# Patient Record
Sex: Female | Born: 1945 | Race: Black or African American | Hispanic: No | Smoking: Never smoker
Health system: Southern US, Community
[De-identification: ages and names within clinical notes are randomized; demographics above are authoritative.]

## PROBLEM LIST (undated history)

## (undated) DIAGNOSIS — E119 Type 2 diabetes mellitus without complications: Secondary | ICD-10-CM

## (undated) DIAGNOSIS — R42 Dizziness and giddiness: Secondary | ICD-10-CM

## (undated) DIAGNOSIS — K219 Gastro-esophageal reflux disease without esophagitis: Secondary | ICD-10-CM

## (undated) DIAGNOSIS — E079 Disorder of thyroid, unspecified: Secondary | ICD-10-CM

## (undated) HISTORY — DX: Gastro-esophageal reflux disease without esophagitis: K21.9

## (undated) HISTORY — DX: Type 2 diabetes mellitus without complications: E11.9

## (undated) HISTORY — PX: ABDOMINAL HYSTERECTOMY: SHX81

## (undated) HISTORY — DX: Disorder of thyroid, unspecified: E07.9

---

## 2016-06-11 ENCOUNTER — Ambulatory Visit (INDEPENDENT_AMBULATORY_CARE_PROVIDER_SITE_OTHER): Payer: Medicare Other | Admitting: Family Medicine

## 2016-06-11 VITALS — BP 114/78 | HR 79 | Temp 98.0°F | Resp 17 | Ht 65.0 in | Wt 190.0 lb

## 2016-06-11 DIAGNOSIS — H6593 Unspecified nonsuppurative otitis media, bilateral: Secondary | ICD-10-CM | POA: Diagnosis not present

## 2016-06-11 DIAGNOSIS — H938X3 Other specified disorders of ear, bilateral: Secondary | ICD-10-CM

## 2016-06-11 NOTE — Progress Notes (Signed)
Patient ID: Sandra Reed, female    DOB: 1946/05/28, 70 y.o.   MRN: [id number]  PCP: No primary care provider on file.  Chief Complaint  Patient presents with  . Ear Problem    Left worse than right. Hx of vertigo.     Subjective:   HPI 69 year old female, presents for evaluation of ear fullness. She was previously seen and evaluated by ENT approximately 2 months prior for the same complaint.  She was then placed on brompheniramine-phenylephrine 1-2.5 mg and Flonase for middle ear effusion and ear fullness.  She presents today with the same complaint of ear fullness of both ears, denies pain, and is currently experiencing post nasal drip. Complains left ear is worst than right.  Left ear has the sensation of "blowing or wind sound".  Denies nausea, headache, fever, or vertigo/dizziness symptoms.  Social History   Social History  . Marital status: Widowed    Spouse name: N/A  . Number of children: N/A  . Years of education: N/A   Occupational History  . Not on file.   Social History Main Topics  . Smoking status: Never Smoker  . Smokeless tobacco: Not on file  . Alcohol use Not on file  . Drug use: Unknown  . Sexual activity: Not on file   Other Topics Concern  . Not on file   Social History Narrative  . No narrative on file   Family History  Problem Relation Age of Onset  . Diabetes Mother   . Hyperlipidemia Mother   . Hypertension Mother   . Cancer Father     Review of Systems See HPI        There are no active problems to display for this patient.    Prior to Admission medications   Medication Sig Start Date End Date Taking? Authorizing Provider  aspirin 81 MG chewable tablet Chew by mouth daily.   Yes Historical Provider, MD  Brompheniramine-Phenylephrine 1-2.5 MG CHEW Chew by mouth.   Yes Historical Provider, MD  fluticasone (FLONASE) 50 MCG/ACT nasal spray Place into both nostrils daily.   Yes Historical Provider, MD  gabapentin (NEURONTIN) 300 MG  capsule Take 300 mg by mouth 3 (three) times daily.   Yes Historical Provider, MD  insulin glargine (LANTUS) 100 UNIT/ML injection Inject into the skin at bedtime.   Yes Historical Provider, MD  levothyroxine (SYNTHROID, LEVOTHROID) 50 MCG tablet Take 50 mcg by mouth daily before breakfast.   Yes Historical Provider, MD  lisinopril (PRINIVIL,ZESTRIL) 20 MG tablet Take 20 mg by mouth daily.   Yes Historical Provider, MD  metFORMIN (GLUCOPHAGE) 500 MG tablet Take by mouth 2 (two) times daily with a meal.   Yes Historical Provider, MD  metoprolol (LOPRESSOR) 50 MG tablet Take 50 mg by mouth 2 (two) times daily.   Yes Historical Provider, MD  omeprazole (PRILOSEC) 40 MG capsule Take 40 mg by mouth daily.   Yes Historical Provider, MD  pentoxifylline (TRENTAL) 400 MG CR tablet Take 400 mg by mouth 3 (three) times daily with meals.   Yes Historical Provider, MD  pravastatin (PRAVACHOL) 20 MG tablet Take 20 mg by mouth daily.   Yes Historical Provider, MD  repaglinide (PRANDIN) 2 MG tablet Take 2 mg by mouth 3 (three) times daily before meals.   Yes Historical Provider, MD   Not on File     Objective:  Physical Exam  Constitutional: She is oriented to person, place, and time. She appears well-developed and well-nourished.  HENT:  Head: Normocephalic and atraumatic.  Right Ear: External ear normal.  Left Ear: External ear normal.  Nonpurulent middle ear effusion present in left and right ear. Bilateral ears are free of cerumen impaction.   Eyes: Conjunctivae and EOM are normal. Pupils are equal, round, and reactive to light.  Neck: Normal range of motion.  Cardiovascular: Normal rate, regular rhythm and normal heart sounds.   Pulmonary/Chest: Effort normal and breath sounds normal.  Musculoskeletal: Normal range of motion.  Neurological: She is alert and oriented to person, place, and time.  Skin: Skin is warm and dry.  Psychiatric: She has a normal mood and affect. Her behavior is normal.  Judgment and thought content normal.   Vitals:   06/11/16 1727  BP: 114/78  Pulse: 79  Resp: 17  Temp: 98 F (36.7 C)     Assessment & Plan:  1. Middle ear effusion, bilateral, non suppurative. 2. Ear fullness, bilateral, likely related to congestion.  Plan: Continue regimen prescribed by ENT Flonase and brompheniramine-phenylephrine 1-2.5 mg. For congestion relief okay to add Cetrizine (zyrtec) 10 mg at bedtime as needed. Follow-up with ENT if symptoms persist.  Godfrey PickKimberly S. Tiburcio PeaHarris, MSN, FNP-C [facility]

## 2016-06-11 NOTE — Patient Instructions (Addendum)
Follow-up with ENT if symptoms persist.  It may take 2-3 months for middle ear effusions to completely resolve.  Okay to take Zyrtec 10 mg at bedtime to relieve ear and nasal congestion.  Follow-up as needed.   IF you received an x-ray today, you will receive an invoice from [facility]. Please contact [facility] at [phone] with questions or concerns regarding your invoice.   IF you received labwork today, you will receive an invoice from [facility]. Please contact [facility] at [phone] with questions or concerns regarding your invoice.   Our billing staff will not be able to assist you with questions regarding bills from these companies.  You will be contacted with the lab results as soon as they are available. The fastest way to get your results is to activate your My Chart account. Instructions are located on the last page of this paperwork. If you have not heard from us regarding the results in 2 weeks, please contact this office.     We recommend that you schedule a mammogram for breast cancer screening. Typically, you do not need a referral to do this. Please contact a local imaging center to schedule your mammogram.  [facility] - [phone]  *ask for the Radiology Department [facility]( Imaging) - [phone] or [phone]) [phone]  [facility] - [phone] [facility] - [phone] [facility] - [phone]  *ask for the Radiology Department [facility] - [phone]) [phone]  *ask for the Radiology Department [facility] - [phone]  *ask for the Mammography Department [facility] - [phone]

## 2016-07-27 ENCOUNTER — Ambulatory Visit (INDEPENDENT_AMBULATORY_CARE_PROVIDER_SITE_OTHER): Payer: Medicare Other | Admitting: Family Medicine

## 2016-07-27 VITALS — BP 122/72 | HR 79 | Temp 98.6°F | Resp 17 | Ht 65.5 in | Wt 190.0 lb

## 2016-07-27 DIAGNOSIS — H9312 Tinnitus, left ear: Secondary | ICD-10-CM | POA: Diagnosis not present

## 2016-07-27 DIAGNOSIS — J329 Chronic sinusitis, unspecified: Secondary | ICD-10-CM

## 2016-07-27 MED ORDER — FLUTICASONE PROPIONATE 50 MCG/ACT NA SUSP: 2.0000 | Freq: Every day | NASAL | 1 refills | Status: DC

## 2016-07-27 MED ORDER — AZITHROMYCIN 250 MG PO TABS: ORAL_TABLET | ORAL | 0 refills | Status: DC

## 2016-07-27 MED ORDER — MONTELUKAST SODIUM 10 MG PO TABS: 10.0000 mg | ORAL_TABLET | Freq: Every day | ORAL | 3 refills | Status: AC

## 2016-07-27 MED ORDER — AZELASTINE HCL 0.15 % NA SOLN: 1.0000 | Freq: Two times a day (BID) | NASAL | 0 refills | Status: DC

## 2016-07-27 NOTE — Patient Instructions (Addendum)
IF you received an x-ray today, you will receive an invoice from [facility]. Please contact [facility] at [phone] with questions or concerns regarding your invoice.   IF you received labwork today, you will receive an invoice from [facility]. Please contact [facility] at [phone] with questions or concerns regarding your invoice.   Our billing staff will not be able to assist you with questions regarding bills from these companies.  You will be contacted with the lab results as soon as they are available. The fastest way to get your results is to activate your My Chart account. Instructions are located on the last page of this paperwork. If you have not heard from us regarding the results in 2 weeks, please contact this office.     Tinnitus Tinnitus refers to hearing a sound when there is no actual source for that sound. This is often described as ringing in the ears. However, people with this condition may hear a variety of noises. A person may hear the sound in one ear or in both ears.  The sounds of tinnitus can be soft, loud, or somewhere in between. Tinnitus can last for a few seconds or can be constant for days. It may go away without treatment and come back at various times. When tinnitus is constant or happens often, it can lead to other problems, such as trouble sleeping and trouble concentrating. Almost everyone experiences tinnitus at some point. Tinnitus that is long-lasting (chronic) or comes back often is a problem that may require medical attention.  CAUSES  The cause of tinnitus is often not known. In some cases, it can result from other problems or conditions, including:   Exposure to loud noises from machinery, music, or other sources.  Hearing loss.  Ear or sinus infections.  Earwax buildup.  A foreign object in the ear.  Use of certain medicines.  Use of alcohol and caffeine.  High blood  pressure.  Heart diseases.  Anemia.  Allergies.  Meniere disease.  Thyroid problems.  Tumors.  An enlarged part of a weakened blood vessel (aneurysm). SYMPTOMS The main symptom of tinnitus is hearing a sound when there is no source for that sound. It may sound like:   Buzzing.  Roaring.  Ringing.  Blowing air, similar to the sound heard when you listen to a seashell.  Hissing.  Whistling.  Sizzling.  Humming.  Running water.  A sustained musical note. DIAGNOSIS  Tinnitus is diagnosed based on your symptoms. Your health care provider will do a physical exam. A comprehensive hearing exam (audiologic exam) will be done if your tinnitus:   Affects only one ear (unilateral).  Causes hearing difficulties.  Lasts 6 months or longer. You may also need to see a health care provider who specializes in hearing disorders (audiologist). You may be asked to complete a questionnaire to determine the severity of your tinnitus. Tests may be done to help determine the cause and to rule out other conditions. These can include:  Imaging studies of your head and brain, such as:  A CT scan.  An MRI.  An imaging study of your blood vessels (angiogram). TREATMENT  Treating an underlying medical condition can sometimes make tinnitus go away. If your tinnitus continues, other treatments may include:  Medicines, such as certain antidepressants or sleeping aids.  Sound generators to mask the tinnitus. These include:  Tabletop sound machines that play relaxing sounds to help you fall asleep.  Wearable devices that  fit in your ear and play sounds or music.  A small device that uses headphones to deliver a signal embedded in music (acoustic neural stimulation). In time, this may change the pathways of your brain and make you less sensitive to tinnitus. This device is used for very severe cases when no other treatment is working.  Therapy and counseling to help you manage the  stress of living with tinnitus.  Using hearing aids or cochlear implants, if your tinnitus is related to hearing loss. HOME CARE INSTRUCTIONS  When possible, avoid being in loud places and being exposed to loud sounds.  Wear hearing protection, such as earplugs, when you are exposed to loud noises.  Do not take stimulants, such as nicotine, alcohol, or caffeine.  Practice techniques for reducing stress, such as meditation, yoga, or deep breathing.  Use a white noise machine, a humidifier, or other devices to mask the sound of tinnitus.  Sleep with your head slightly raised. This may reduce the impact of tinnitus.  Try to get plenty of rest each night. SEEK MEDICAL CARE IF:  You have tinnitus in just one ear.  Your tinnitus continues for 3 weeks or longer without stopping.  Home care measures are not helping.  You have tinnitus after a head injury.  You have tinnitus along with any of the following:  Dizziness.  Loss of balance.  Nausea and vomiting.   This information is not intended to replace advice given to you by your health care provider. Make sure you discuss any questions you have with your health care provider.   Document Released: 09/10/2005 Document Revised: 10/01/2014 Document Reviewed: 02/10/2014 Elsevier Interactive Patient Education [employer]2016 Elsevier Inc.

## 2016-07-27 NOTE — Progress Notes (Signed)
Subjective:  By signing my name below, I, Stann Oresung-Kai Tsai, attest that this documentation has been prepared under the direction and in the presence of Norberto SorensonEva Lynasia Meloche, MD. Electronically Signed: Stann Oresung-Kai Tsai, Scribe. 07/27/2016 , 6:09 PM .  Patient was seen in Room 14 .   Patient ID: Sandra Reed Juba, female    DOB: 04/03/1946, 70 y.o.   MRN: [id number] Chief Complaint  Patient presents with  . Sinusitis  . URI   HPI Sandra Reed Withers is a 70 y.o. female who [facility] complaining of sinus pressure and congestion with chills for the past 3 days. She's been coughing up phlegm and blowing out mucus. She's been having intermittent tinnitus in her left ear and is followed by ENT (in [place]) for this. She's been taking zyrtec qd but it's making her sleepy and drying her up. She's also been using nasal spray (dymista) twice a day (morning and night).   She was taking cefdinir for her sinus infections, prescribed by Dr. Vear ClockPhillips in [place]. She is from [place] but she's been here in [place] more because her daughter lives here.   Past Medical History:  Diagnosis Date  . Diabetes mellitus without complication (HCC)   . GERD (gastroesophageal reflux disease)   . Thyroid disease    Prior to Admission medications   Medication Sig Start Date End Date Taking? Authorizing Provider  aspirin 81 MG chewable tablet Chew by mouth daily.   Yes Historical Provider, MD  Azelastine-Fluticasone (DYMISTA NA) Place into the nose.   Yes Historical Provider, MD  Brompheniramine-Phenylephrine 1-2.5 MG CHEW Chew by mouth.   Yes Historical Provider, MD  gabapentin (NEURONTIN) 300 MG capsule Take 300 mg by mouth 3 (three) times daily.   Yes Historical Provider, MD  insulin glargine (LANTUS) 100 UNIT/ML injection Inject into the skin at bedtime.   Yes Historical Provider, MD  levothyroxine (SYNTHROID, LEVOTHROID) 50 MCG tablet Take 50 mcg by mouth daily before breakfast.   Yes Historical Provider, MD    lisinopril (PRINIVIL,ZESTRIL) 20 MG tablet Take 20 mg by mouth daily.   Yes Historical Provider, MD  metFORMIN (GLUCOPHAGE) 500 MG tablet Take by mouth 2 (two) times daily with a meal.   Yes Historical Provider, MD  metoprolol (LOPRESSOR) 50 MG tablet Take 50 mg by mouth 2 (two) times daily.   Yes Historical Provider, MD  omeprazole (PRILOSEC) 40 MG capsule Take 40 mg by mouth daily.   Yes Historical Provider, MD  pentoxifylline (TRENTAL) 400 MG CR tablet Take 400 mg by mouth 3 (three) times daily with meals.   Yes Historical Provider, MD  pravastatin (PRAVACHOL) 20 MG tablet Take 20 mg by mouth daily.   Yes Historical Provider, MD  repaglinide (PRANDIN) 2 MG tablet Take 2 mg by mouth 3 (three) times daily before meals.   Yes Historical Provider, MD  fluticasone (FLONASE) 50 MCG/ACT nasal spray Place into both nostrils daily.    Historical Provider, MD   Not on File  Review of Systems  Constitutional: Positive for chills and fatigue. Negative for fever and unexpected weight change.  HENT: Positive for congestion, sinus pressure and tinnitus.   Respiratory: Positive for cough.   Gastrointestinal: Negative for constipation, diarrhea, nausea and vomiting.  Skin: Negative for rash and wound.  Neurological: Negative for dizziness, weakness and headaches.       Objective:   Physical Exam  Constitutional: She is oriented to person, place, and time. She appears well-developed and well-nourished. No distress.  HENT:  Head: Normocephalic  and atraumatic.  Right Ear: Tympanic membrane is injected and retracted.  Left Ear: A middle ear effusion (moderate) is present.  Nose: Nose normal.  Mouth/Throat: Oropharynx is clear and moist.  Eyes: EOM are normal. Pupils are equal, round, and reactive to light.  Neck: Neck supple. No thyromegaly present.  Cardiovascular: Normal rate, regular rhythm and normal heart sounds.   No murmur heard. Pulmonary/Chest: Effort normal and breath sounds normal. No  respiratory distress. She has no wheezes.  Musculoskeletal: Normal range of motion.  Lymphadenopathy:    She has no cervical adenopathy.  Neurological: She is alert and oriented to person, place, and time.  Skin: Skin is warm and dry.  Psychiatric: She has a normal mood and affect. Her behavior is normal.  Nursing note and vitals reviewed.   BP 122/72 (BP Location: Right Arm, Patient Position: Sitting, Cuff Size: Normal)   Pulse 79   Temp 98.6 F (37 C) (Oral)   Resp 17   Ht 5' 5.5" (1.664 m)   Wt 190 lb (86.2 kg)   SpO2 98%   BMI 31.14 kg/m     Assessment & Plan:   1. Chronic sinusitis, unspecified location   2. Tinnitus of left ear    Pt has long-standing hx of allergies and recurrent sinusitis but is followed out of town.  Has not had any sinus imaging or allergy testing prior.  Could not tolerate zyrtec so consider re-trying allegra with singulair. Currently on Dymista samples so gave rxs for the med components in case her ins doesn't cover the dymista rx.   Advised trying sinus rinse (gave sample) or netti pot. Try cool mist humidifier by her bed. Try nasal saline and steam treatments.  Will refer to Dr. Suszanne Connerseoh for further eval of the tinnitus.   Orders Placed This Encounter  Procedures  . Ambulatory referral to ENT    Referral Priority:   Routine    Referral Type:   Consultation    Referral Reason:   Specialty Services Required    Requested Specialty:   Otolaryngology    Number of Visits Requested:   1    Meds ordered this encounter  Medications  . Azelastine-Fluticasone (DYMISTA NA)    Sig: Place into the nose.  Marland Kitchen. azithromycin (ZITHROMAX) 250 MG tablet    Sig: Take 2 tabs PO x 1 dose, then 1 tab PO QD x 4 days    Dispense:  6 tablet    Refill:  0  . fluticasone (FLONASE) 50 MCG/ACT nasal spray    Sig: Place 2 sprays into both nostrils daily.    Dispense:  16 g    Refill:  1  . Azelastine HCl 0.15 % SOLN    Sig: Place 1 spray into the nose 2 (two) times  daily.    Dispense:  30 mL    Refill:  0  . montelukast (SINGULAIR) 10 MG tablet    Sig: Take 1 tablet (10 mg total) by mouth at bedtime.    Dispense:  30 tablet    Refill:  3    I personally performed the services described in this documentation, which was scribed in my presence. The recorded information has been reviewed and considered, and addended by me as needed.   Norberto SorensonEva Argyle Gustafson, M.D.  [facility]  [place] [address] [place], [place] [address] [phone] phone [phone] fax  07/27/16 6:48 PM

## 2016-08-03 ENCOUNTER — Ambulatory Visit (INDEPENDENT_AMBULATORY_CARE_PROVIDER_SITE_OTHER): Payer: Medicare Other | Admitting: Family Medicine

## 2016-08-03 ENCOUNTER — Ambulatory Visit (INDEPENDENT_AMBULATORY_CARE_PROVIDER_SITE_OTHER): Payer: Medicare Other

## 2016-08-03 VITALS — BP 116/64 | HR 78 | Temp 98.4°F | Resp 16 | Ht 65.5 in | Wt 194.0 lb

## 2016-08-03 DIAGNOSIS — M79675 Pain in left toe(s): Secondary | ICD-10-CM

## 2016-08-03 DIAGNOSIS — S92355A Nondisplaced fracture of fifth metatarsal bone, left foot, initial encounter for closed fracture: Secondary | ICD-10-CM

## 2016-08-03 MED ORDER — TRAMADOL HCL 50 MG PO TABS: 50.0000 mg | ORAL_TABLET | Freq: Three times a day (TID) | ORAL | 0 refills | Status: DC | PRN

## 2016-08-03 NOTE — Progress Notes (Signed)
Patient ID: Sandra Reed, female    DOB: 06/08/1946, 70 y.o.   MRN: [id number]  PCP: No PCP Per Patient  Chief Complaint  Patient presents with  . Toe Injury    Injuried toe last night     Subjective:   HPI 70 year old female presents for evaluation of a left toe injury times 1 day. [facility]. Reports walking and hit the side of her foot on a whicker chair last night. Applied ice and took tylenol with minimal relief of symptoms. Pt reports noticing that the distal portion of her left 5th digit has become darker and bruises today in addition to swollen. She is also experiencing mild tenderness of the 4th toe is slightly swollen.  Social History   Social History  . Marital status: Widowed    Spouse name: N/A  . Number of children: N/A  . Years of education: N/A   Occupational History  . Not on file.   Social History Main Topics  . Smoking status: Never Smoker  . Smokeless tobacco: Not on file  . Alcohol use Not on file  . Drug use: Unknown  . Sexual activity: Not on file   Other Topics Concern  . Not on file   Social History Narrative  . No narrative on file    Family History  Problem Relation Age of Onset  . Diabetes Mother   . Hyperlipidemia Mother   . Hypertension Mother   . Cancer Father    Review of Systems See HPI  There are no active problems to display for this patient.    Prior to Admission medications   Medication Sig Start Date End Date Taking? Authorizing Provider  aspirin 81 MG chewable tablet Chew by mouth daily.   Yes Historical Provider, MD  Azelastine-Fluticasone (DYMISTA NA) Place into the nose.   Yes Historical Provider, MD  azithromycin (ZITHROMAX) 250 MG tablet Take 2 tabs PO x 1 dose, then 1 tab PO QD x 4 days 07/27/16  Yes Sherren MochaEva N Shaw, MD  gabapentin (NEURONTIN) 300 MG capsule Take 300 mg by mouth 3 (three) times daily.   Yes Historical Provider, MD  insulin glargine (LANTUS) 100 UNIT/ML injection Inject into the skin at  bedtime.   Yes Historical Provider, MD  levothyroxine (SYNTHROID, LEVOTHROID) 50 MCG tablet Take 50 mcg by mouth daily before breakfast.   Yes Historical Provider, MD  lisinopril (PRINIVIL,ZESTRIL) 20 MG tablet Take 20 mg by mouth daily.   Yes Historical Provider, MD  metFORMIN (GLUCOPHAGE) 500 MG tablet Take by mouth 2 (two) times daily with a meal.   Yes Historical Provider, MD  metoprolol (LOPRESSOR) 50 MG tablet Take 50 mg by mouth 2 (two) times daily.   Yes Historical Provider, MD  montelukast (SINGULAIR) 10 MG tablet Take 1 tablet (10 mg total) by mouth at bedtime. 07/27/16  Yes Sherren MochaEva N Shaw, MD  omeprazole (PRILOSEC) 40 MG capsule Take 40 mg by mouth daily.   Yes Historical Provider, MD  pentoxifylline (TRENTAL) 400 MG CR tablet Take 400 mg by mouth 3 (three) times daily with meals.   Yes Historical Provider, MD  pravastatin (PRAVACHOL) 20 MG tablet Take 20 mg by mouth daily.   Yes Historical Provider, MD  repaglinide (PRANDIN) 2 MG tablet Take 2 mg by mouth 3 (three) times daily before meals.   Yes Historical Provider, MD  Azelastine HCl 0.15 % SOLN Place 1 spray into the nose 2 (two) times daily. Patient not taking:  Reported on 08/03/2016 07/27/16   Sherren MochaEva N Shaw, MD  Brompheniramine-Phenylephrine 1-2.5 MG CHEW Chew by mouth.    Historical Provider, MD  fluticasone (FLONASE) 50 MCG/ACT nasal spray Place 2 sprays into both nostrils daily. Patient not taking: Reported on 08/03/2016 07/27/16   Sherren MochaEva N Shaw, MD   No Known Allergies     Objective:  Physical Exam  Constitutional: She is oriented to person, place, and time. She appears well-developed and well-nourished.  HENT:  Head: Normocephalic and atraumatic.  Neck: Normal range of motion.  Cardiovascular: Normal rate.   Pulmonary/Chest: Effort normal.  Musculoskeletal:       Left foot: There is bony tenderness and swelling.  Lateral and medial bony tenderness with palpation at the base of 5th and 4th toe. Swelling laterally of the 5th  toe.   Neurological: She is alert and oriented to person, place, and time.  Skin: Skin is warm and dry.  Psychiatric: She has a normal mood and affect. Her behavior is normal. Judgment and thought content normal.   Vitals:   08/03/16 1604  BP: 116/64  Pulse: 78  Resp: 16  Temp: 98.4 F (36.9 C)   Assessment & Plan:  1. Closed nondisplaced fracture of fifth metatarsal bone of left foot, initial encounter Plan: - DG Foot Complete Left - AMB referral to orthopedics -Buddy taped 5th toe  -Apply postop shoe -Take Tramadol 50 mg every 8 hours as needed for pain.  Follow-up as needed.  Godfrey PickKimberly S. Tiburcio PeaHarris, MSN, FNP-C [facility]

## 2016-08-03 NOTE — Patient Instructions (Addendum)
Continue to buddy tape left 5th toe. Wear post op shoe and avoid persistent weight bearing on left foot. Take Tramadol 50 mg every 8 hours for pain as needed. I have referred you to orthopedics for further evaluation.    IF you received an x-ray today, you will receive an invoice from [facility]. Please contact [facility] at [phone] with questions or concerns regarding your invoice.   IF you received labwork today, you will receive an invoice from [facility]. Please contact [facility] at [phone] with questions or concerns regarding your invoice.   Our billing staff will not be able to assist you with questions regarding bills from these companies.  You will be contacted with the lab results as soon as they are available. The fastest way to get your results is to activate your My Chart account. Instructions are located on the last page of this paperwork. If you have not heard from us regarding the results in 2 weeks, please contact this office.    Metatarsal Fracture A metatarsal fracture is a break in a metatarsal bone. Metatarsal bones connect your toe bones to your ankle bones. CAUSES This type of fracture may be caused by:  A sudden twisting of your foot.  A fall onto your foot.  Overuse or repetitive exercise. RISK FACTORS This condition is more likely to develop in people who:  Play contact sports.  Have a bone disease.  Have a low calcium level. SYMPTOMS Symptoms of this condition include:  Pain that is worse when walking or standing.  Pain when pressing on the foot or moving the toes.  Swelling.  Bruising on the top or bottom of the foot.  A foot that appears shorter than the other one. DIAGNOSIS This condition is diagnosed with a physical exam. You may also have imaging tests, such as:  X-rays.  A CT scan.  MRI. TREATMENT Treatment for this condition depends on its severity and whether a bone has  moved out of place. Treatment may involve:  Rest.  Wearing foot support such as a cast, splint, or boot for several weeks.  Using crutches.  Surgery to move bones back into the right position. Surgery is usually needed if there are many pieces of broken bone or bones that are very out of place (displaced fracture).  Physical therapy. This may be needed to help you regain full movement and strength in your foot. You will need to return to your health care provider to have X-rays taken until your bones heal. Your health care provider will look at the X-rays to make sure that your foot is healing well. HOME CARE INSTRUCTIONS  If You Have a Cast:  Do not stick anything inside the cast to scratch your skin. Doing that increases your risk of infection.  Check the skin around the cast every day. Report any concerns to your health care provider. You may put lotion on dry skin around the edges of the cast. Do not apply lotion to the skin underneath the cast.  Keep the cast clean and dry. If You Have a Splint or a Supportive Boot:  Wear it as directed by your health care provider. Remove it only as directed by your health care provider.  Loosen it if your toes become numb and tingle, or if they turn cold and blue.  Keep it clean and dry. Bathing  Do not take baths, swim, or use a hot tub until your health care provider approves. Ask your health  care provider if you can take showers. You may only be allowed to take sponge baths for bathing.  If your health care provider approves bathing and showering, cover the cast or splint with a watertight plastic bag to protect it from water. Do not let the cast or splint get wet. Managing Pain, Stiffness, and Swelling  If directed, apply ice to the injured area (if you have a splint, not a cast).  Put ice in a plastic bag.  Place a towel between your skin and the bag.  Leave the ice on for 20 minutes, 2-3 times per day.  Move your toes often to  avoid stiffness and to lessen swelling.  Raise (elevate) the injured area above the level of your heart while you are sitting or lying down. Driving  Do not drive or operate heavy machinery while taking pain medicine.  Do not drive while wearing foot support on a foot that you use for driving. Activity  Return to your normal activities as directed by your health care provider. Ask your health care provider what activities are safe for you.  Perform exercises as directed by your health care provider or physical therapist. Safety  Do not use the injured foot to support your body weight until your health care provider says that you can. Use crutches as directed by your health care provider. General Instructions  Do not put pressure on any part of the cast or splint until it is fully hardened. This may take several hours.  Do not use any tobacco products, including cigarettes, chewing tobacco, or e-cigarettes. Tobacco can delay bone healing. If you need help quitting, ask your health care provider.  Take medicines only as directed by your health care provider.  Keep all follow-up visits as directed by your health care provider. This is important. SEEK MEDICAL CARE IF:  You have a fever.  Your cast, splint, or boot is too loose or too tight.  Your cast, splint, or boot is damaged.  Your pain medicine is not helping.  You have pain, tingling, or numbness in your foot that is not going away. SEEK IMMEDIATE MEDICAL CARE IF:  You have severe pain.  You have tingling or numbness in your foot that is getting worse.  Your foot feels cold or becomes numb.  Your foot changes color.   This information is not intended to replace advice given to you by your health care provider. Make sure you discuss any questions you have with your health care provider.   Document Released: 06/02/2002 Document Revised: 01/25/2015 Document Reviewed: 07/07/2014 Elsevier Interactive Patient Education  2016 Elsevier Inc.    Metatarsal Fracture With Rehab A metatarsal fracture is a broken bone in one of the five bones that connect your toes to the rest of your foot (forefoot fracture). Metatarsals are long bones that can be stressed or cracked easily. A metatarsal fracture can be:  A stress fracture. Stress fractures are cracks in the surface of the metatarsal bone. Athletes often get stress fractures.  A complete fracture. A complete fracture goes all the way through the bone. The bone that connects to the pinky toe (fifth metatarsal) is the most commonly fractured metatarsal. Ballet dancers often fracture this bone. CAUSES  Stress fractures may be caused by:  Poor training technique.  Sudden increase in activity.  Changing your activity to a harder surface.  Wearing athletic shoes that do not have enough cushioning. Complete fractures are usually caused by:  Dropping a heavy object on  your foot.  An injury that severely twists your foot. SIGNS AND SYMPTOMS The most common symptom of a stress fracture is foot pain that goes away with rest. The most common symptom of a complete fracture is intense pain that persists after the injury. Other symptoms of both fracture types include:  Bruising.  Swelling.  Pain with movement or putting weight on the foot.  Tenderness or pain with pressure.  Trouble walking. DIAGNOSIS  Your health care provider may suspect a metatarsal fracture based on your symptoms and medical history. Your health care provider will also do a physical exam. During the physical exam, your health care provider may try to move your foot and toes to check for pain and limited movement. Your foot will also be checked for:  Bruising.  Tenderness.  Swelling.  Deformity. Other tests that may be done include:  X-rays. X-rays are able to show most fractures.  A bone scan. This test may be necessary to show a stress fracture. TREATMENT  Treatment for a  metatarsal fracture depends on how severe the fracture was and the type of fracture. Treatment may include:  Surgery. Surgery is usually needed to repair a displaced fracture. A displaced fracture happens when pieces of the broken bone are moved out of place (displacement). After surgery, you may need to wear a short walking cast for 6 to 8 weeks.  Medicines. Medicines may be used to reduce swelling and pain.  Physical therapy. Physical therapy may last for several months.  Use of a supportive device, such as:  Elastic wrap.  Splint.  Boot.  Cast.  Crutches to support weight on your foot until the broken bone heals. You can usually treat a stress fracture or a nondisplaced fracture with:   Rest.  Ice.  Elevation.  Support. HOME CARE INSTRUCTIONS  Follow all your health care provider's instructions.  Take medicine only as directed by your health care provider.  Rest your foot until your health care provider says you can resume your usual activities.  When resting, keep your foot raised above the level of your heart (elevated).  Ice may help reduce pain and swelling.  Place ice in a plastic bag.  Place a towel between your skin and the bag.  Leave the ice on for 20 minutes, 2-3 times a day.  Wear your supportive device as directed.  Do not get your cast or splint wet.  Keep all follow-up visits as directed by your health care provider. If your health care provider recommended physical therapy, it is very important for proper healing of your injury that you keep all visits. PREVENTION  After your fracture has healed, you can prevent another fracture by:  Starting new sports activities gradually.  Cross training to avoid putting stress on the same part of your foot every day (such as alternate running with swimming or biking).  Eat a healthy diet that includes plenty of calcium and vitamin D.  Wear the right athletic shoes for your sport. Replace them when they  wear out.  Stop your activity or training if you have pain or swelling in your foot. Rest for a few days. SEEK MEDICAL CARE IF:  You have pain that is getting worse.  You develop chills or fever.  Your foot feels numb.  Your cast or splint is damaged.  You notice swelling or redness below your cast or splint. WHAT REHABILITATION EXERCISES CAN I DO AT HOME? Ask your health care provider or physical therapist when you  can start doing exercises at home. Doing these exercises 3-5 times a week can help you regain strength and flexibility. If doing any of these exercises causes pain, stop and contact your health care provider or physical therapist. Heel Cord Stretch Do 2 sets of 10 repetitions.  Stand facing a wall with your healthy foot forward and your knee slightly bent.  Place both hands on the wall for support.  Stretch your healing foot out straight behind you.  Keep both heels on the floor.  Hold the stretch for 30 seconds.  You can also do this exercise with both knees slightly bent. Repeat the same steps. Golf [employer] Do this once every day.  Sit on a chair and roll a golf ball under your healing foot for two minutes. Towel Stretch  Sit on the floor with your legs out straight.  Loop a towel around the front of your healing foot.  Use both hands to pull the ends of the towel, stretching your foot back toward your body.  Hold the stretch for 30 seconds and repeat 3 times. Calf Raises Do 2 sets of 10 repetitions.  Stand behind a chair and use your hands to support you.  Lift your good foot off the ground.  Rise up on the front of your healing foot as high as you can.  Repeat the lift 10 times. Marble Pickup Do this once a day.  Sit in a chair and put 20 marbles on the floor in front of you.  Use the toes of your healing foot to pick up each marble. Towel Curls Repeat this exercise 5 times.  Sit in a chair and place a small towel on the floor in front  of you.  Use the toes of your healing foot to grab the towel and pull it toward you.   This information is not intended to replace advice given to you by your health care provider. Make sure you discuss any questions you have with your health care provider.   Document Released: 09/10/2005 Document Revised: 01/25/2015 Document Reviewed: 12/24/2013 Elsevier Interactive Patient Education [employer]

## 2016-08-09 ENCOUNTER — Ambulatory Visit (INDEPENDENT_AMBULATORY_CARE_PROVIDER_SITE_OTHER): Payer: Medicare Other | Admitting: Orthopaedic Surgery

## 2016-08-20 ENCOUNTER — Ambulatory Visit (INDEPENDENT_AMBULATORY_CARE_PROVIDER_SITE_OTHER): Payer: Medicare Other | Admitting: Physician Assistant

## 2016-08-20 VITALS — BP 112/68 | HR 78 | Temp 98.4°F | Resp 16 | Ht 65.0 in | Wt 190.0 lb

## 2016-08-20 DIAGNOSIS — S92355D Nondisplaced fracture of fifth metatarsal bone, left foot, subsequent encounter for fracture with routine healing: Secondary | ICD-10-CM

## 2016-08-20 DIAGNOSIS — L03032 Cellulitis of left toe: Secondary | ICD-10-CM | POA: Diagnosis not present

## 2016-08-20 MED ORDER — CEPHALEXIN 500 MG PO CAPS: 500.0000 mg | ORAL_CAPSULE | Freq: Four times a day (QID) | ORAL | 0 refills | Status: AC

## 2016-08-20 NOTE — Patient Instructions (Addendum)
  Take antibiotic as prescribed. Keep buddy tape on for the next week or so. Make sure you change it daily especially if it gets wet. If you do not see significant improvement in the affected spot or you develop any new spots, come back for further evaluation.    IF you received an x-ray today, you will receive an invoice from [facility]. Please contact [facility] at [phone] with questions or concerns regarding your invoice.   IF you received labwork today, you will receive an invoice from [facility]. Please contact [facility] at [phone] with questions or concerns regarding your invoice.   Our billing staff will not be able to assist you with questions regarding bills from these companies.  You will be contacted with the lab results as soon as they are available. The fastest way to get your results is to activate your My Chart account. Instructions are located on the last page of this paperwork. If you have not heard from us regarding the results in 2 weeks, please contact this office.

## 2016-08-20 NOTE — Progress Notes (Signed)
Sandra Reed Girardin  MRN: [id number] DOB: 06/26/1946  Subjective:  Sandra Reed Beed is a 70 y.o. female seen in office today for a chief complaint of sore on 4th digit of left foot.   Pt initially seen on 08/03/16 and had a closed nondisplaced fracture of fifth metatarsal bone of left foot. It was buddy taped to the adjacent toe. Pt notes she just took the buddy tape off yesterday for the first time and there was an open sore on the fourth toe that she wanted to get evaluated because she has diabetes and she noticed some moderated redness yesterday.  Denies itching,  pain, warmth, and purulent drainage.  Review of Systems  Constitutional: Negative for chills, diaphoresis, fatigue and fever.  Musculoskeletal: Negative for gait problem.   There are no active problems to display for this patient.   Current Outpatient Prescriptions on File Prior to Visit  Medication Sig Dispense Refill  . aspirin 81 MG chewable tablet Chew by mouth daily.    . Azelastine-Fluticasone (DYMISTA NA) Place into the nose.    . gabapentin (NEURONTIN) 300 MG capsule Take 300 mg by mouth 3 (three) times daily.    . insulin glargine (LANTUS) 100 UNIT/ML injection Inject 41 Units into the skin at bedtime.     Marland Kitchen. levothyroxine (SYNTHROID, LEVOTHROID) 50 MCG tablet Take 50 mcg by mouth daily before breakfast.    . lisinopril (PRINIVIL,ZESTRIL) 20 MG tablet Take 20 mg by mouth daily.    . metFORMIN (GLUCOPHAGE) 500 MG tablet Take by mouth 2 (two) times daily with a meal.    . metoprolol (LOPRESSOR) 50 MG tablet Take 50 mg by mouth 2 (two) times daily.    . montelukast (SINGULAIR) 10 MG tablet Take 1 tablet (10 mg total) by mouth at bedtime. 30 tablet 3  . omeprazole (PRILOSEC) 40 MG capsule Take 40 mg by mouth daily.    . pentoxifylline (TRENTAL) 400 MG CR tablet Take 400 mg by mouth 2 (two) times daily with a meal.     . pravastatin (PRAVACHOL) 20 MG tablet Take 20 mg by mouth daily.    . repaglinide (PRANDIN) 2 MG tablet Take 2  mg by mouth 3 (three) times daily before meals.    . traMADol (ULTRAM) 50 MG tablet Take 1 tablet (50 mg total) by mouth every 8 (eight) hours as needed for moderate pain. 30 tablet 0   No current facility-administered medications on file prior to visit.     No Known Allergies   Objective:  BP 112/68 (BP Location: Right Arm, Patient Position: Sitting, Cuff Size: Normal)   Pulse 78   Temp 98.4 F (36.9 C) (Oral)   Resp 16   Ht 5\' 5"  (1.651 m)   Wt 190 lb (86.2 kg)   SpO2 98%   BMI 31.62 kg/m   Physical Exam  Constitutional: She is oriented to person, place, and time and well-developed, well-nourished, and in no distress.  HENT:  Head: Normocephalic and atraumatic.  Eyes: Conjunctivae are normal.  Neck: Normal range of motion.  Pulmonary/Chest: Effort normal.  Musculoskeletal:       Feet:  Neurological: She is alert and oriented to person, place, and time. Gait normal.  Skin: Skin is warm and dry.  Psychiatric: Affect normal.  Vitals reviewed.   Assessment and Plan :  1. Cellulitis of toe of left foot - cephALEXin (KEFLEX) 500 MG capsule; Take 1 capsule (500 mg total) by mouth 4 (four) times daily.  Dispense: 20 capsule; Refill:  0 -Return to clinic if symptoms worsen, do not improve in five days , or as needed  2. Closed nondisplaced fracture of fifth metatarsal bone of left foot with routine healing, subsequent encounter -Buddy tape reapplied with nonadherent dressing over new abrasion of 4th digit.  -Instructed to change this taping more frequently especially if it becomes wet.   Benjiman CoreBrittany Chonita Gadea PA-C  [facility] 08/20/2016 6:28 PM

## 2016-09-10 ENCOUNTER — Ambulatory Visit (INDEPENDENT_AMBULATORY_CARE_PROVIDER_SITE_OTHER): Payer: Medicare Other | Admitting: Orthopaedic Surgery

## 2016-09-10 ENCOUNTER — Encounter (INDEPENDENT_AMBULATORY_CARE_PROVIDER_SITE_OTHER): Payer: Self-pay | Admitting: Orthopaedic Surgery

## 2016-09-10 VITALS — BP 97/59 | HR 77 | Resp 14 | Ht 65.0 in | Wt 190.0 lb

## 2016-09-10 DIAGNOSIS — S92515D Nondisplaced fracture of proximal phalanx of left lesser toe(s), subsequent encounter for fracture with routine healing: Secondary | ICD-10-CM

## 2016-09-10 NOTE — Progress Notes (Signed)
   Office Visit Note   Patient: Sandra LenzSaundra Alanis           Date of Birth: 02/28/1946           MRN: [id number] Visit Date: 09/10/2016              Requested by: Doyle AskewKimberly Stephenia Harris, FNP [address] [place], [place] [id number] PCP: No PCP Per Patient   Assessment & Plan: Visit Diagnoses: 6 weeks status post closed fracture midportion proximal phalanx left little toe in a diabetic with resultant small ulcer of fourth toe  Plan: mupropicin ointment between third and fourth toes with small cotton gauze. Follow up 1 week    Follow-Up Instructions: No Follow-up on file.   Orders:  No orders of the defined types were placed in this encounter.  No orders of the defined types were placed in this encounter.     Procedures: No procedures performed   Clinical Data: No additional findings.   Subjective: Chief Complaint  Patient presents with  . Left 5th Toe - Pain    Pt went to Urgent care and xrays were obtained. 5th toe on Left foot fx, 4th and 5th toe tapede together and it created an ulcer between 3rd and 4th toe  Review of Systems   Objective: Vital Signs: There were no vitals taken for this visit.  Physical Exam  Ortho Exam left fourth toe is edematous but without erythema or ecchymosis. There is a 3 x 3 mm ulcer along the base of the proximal phalanx medially without drainage. The base is clean and consistent with granulomatous material. Good capillary refill to toe. Normal sensibility. No deformity of fifth toe that was fractured. No ascending lymphangitis  Specialty Comments:  No specialty comments available.  Imaging: No results found.   PMFS History: There are no active problems to display for this patient.  Past Medical History:  Diagnosis Date  . Diabetes mellitus without complication (HCC)   . GERD (gastroesophageal reflux disease)   . Thyroid disease     Family History  Problem Relation Age of Onset  . Diabetes Mother   . Hyperlipidemia Mother     . Hypertension Mother   . Cancer Father     Past Surgical History:  Procedure Laterality Date  . ABDOMINAL HYSTERECTOMY     Social History   Occupational History  . Not on file.   Social History Main Topics  . Smoking status: Never Smoker  . Smokeless tobacco: Never Used  . Alcohol use Not on file  . Drug use: Unknown  . Sexual activity: Not on file

## 2016-09-21 ENCOUNTER — Ambulatory Visit (INDEPENDENT_AMBULATORY_CARE_PROVIDER_SITE_OTHER): Payer: Medicare Other | Admitting: Orthopaedic Surgery

## 2016-09-21 ENCOUNTER — Encounter (INDEPENDENT_AMBULATORY_CARE_PROVIDER_SITE_OTHER): Payer: Self-pay | Admitting: Orthopaedic Surgery

## 2016-09-21 VITALS — BP 115/64 | HR 73 | Resp 14 | Ht 66.0 in | Wt 194.0 lb

## 2016-09-21 DIAGNOSIS — L97521 Non-pressure chronic ulcer of other part of left foot limited to breakdown of skin: Secondary | ICD-10-CM | POA: Diagnosis not present

## 2016-09-21 NOTE — Progress Notes (Signed)
   Office Visit Note   Patient: Sandra LenzSaundra Reed           Date of Birth: 05/12/1946           MRN: [id number] Visit Date: 09/21/2016              Requested by: No referring provider defined for this encounter. PCP: No PCP Per Patient   Assessment & Plan: Visit Diagnoses: Status post fracture proximal phalanx left little toe with healing. Secondary ulceration of fourth toe related to bandage. Appears to be healing without sequelae.  Plan: Continue with daily mupropicin  application to fourth toe. Check skin on a daily basis. Follow-up on a when necessary basis. The ulcer appears to have completely epithelialized and swelling of the fourth toe is decreased significantly.  Follow-Up Instructions: No Follow-up on file.   Orders:  No orders of the defined types were placed in this encounter.  No orders of the defined types were placed in this encounter.     Procedures: No procedures performed   Clinical Data: No additional findings.   Subjective: Chief Complaint  Patient presents with  . Left 4th Toe - Follow-up    Improving    Review of Systems   Objective: Vital Signs: BP 115/64 (BP Location: Right Arm, Patient Position: Sitting, Cuff Size: Normal)   Pulse 73   Resp 14   Ht 5\' 6"  (1.676 m)   Wt 194 lb (88 kg)   BMI 31.31 kg/m   Physical Exam  Ortho Exam left foot evaluation demonstrates no deformity of the little toe where there was a fracture of the proximal phalanx. Was no significant pain or swelling of the little toe. Patient developed an ulcer along the medial border at the base of the fourth toe as a result of the dressing applied in the emergency room. She has been treating this locally with near resolution of the ulcer. It has completely epithelialized third. There is no drainage. Swelling of the toe is significantly decreased. Good capillary refill to the toes. Does have some peripheral neuropathy related to her diabetes but did good pulses.  Specialty  Comments:  No specialty comments available.  Imaging: No results found.   PMFS History: There are no active problems to display for this patient.  Past Medical History:  Diagnosis Date  . Diabetes mellitus without complication (HCC)   . GERD (gastroesophageal reflux disease)   . Thyroid disease     Family History  Problem Relation Age of Onset  . Diabetes Mother   . Hyperlipidemia Mother   . Hypertension Mother   . Cancer Father     Past Surgical History:  Procedure Laterality Date  . ABDOMINAL HYSTERECTOMY     Social History   Occupational History  . Not on file.   Social History Main Topics  . Smoking status: Never Smoker  . Smokeless tobacco: Never Used  . Alcohol use No  . Drug use: No  . Sexual activity: Not on file

## 2016-11-19 ENCOUNTER — Encounter (HOSPITAL_COMMUNITY): Payer: Self-pay | Admitting: Emergency Medicine

## 2016-11-19 ENCOUNTER — Emergency Department (HOSPITAL_COMMUNITY): Payer: Medicare Other

## 2016-11-19 ENCOUNTER — Emergency Department (HOSPITAL_COMMUNITY)
Admission: EM | Admit: 2016-11-19 | Discharge: 2016-11-19 | Disposition: A | Discharge status: Home / Self Care | Payer: Medicare Other | Attending: Emergency Medicine | Admitting: Emergency Medicine

## 2016-11-19 DIAGNOSIS — M25512 Pain in left shoulder: Secondary | ICD-10-CM | POA: Insufficient documentation

## 2016-11-19 DIAGNOSIS — Z7982 Long term (current) use of aspirin: Secondary | ICD-10-CM | POA: Insufficient documentation

## 2016-11-19 DIAGNOSIS — Z7984 Long term (current) use of oral hypoglycemic drugs: Secondary | ICD-10-CM | POA: Insufficient documentation

## 2016-11-19 DIAGNOSIS — R079 Chest pain, unspecified: Secondary | ICD-10-CM

## 2016-11-19 DIAGNOSIS — K30 Functional dyspepsia: Secondary | ICD-10-CM

## 2016-11-19 DIAGNOSIS — M79602 Pain in left arm: Secondary | ICD-10-CM

## 2016-11-19 DIAGNOSIS — E119 Type 2 diabetes mellitus without complications: Secondary | ICD-10-CM | POA: Insufficient documentation

## 2016-11-19 DIAGNOSIS — Z794 Long term (current) use of insulin: Secondary | ICD-10-CM | POA: Diagnosis not present

## 2016-11-19 LAB — I-STAT TROPONIN, ED
Troponin i, poc: 0 ng/mL (ref 0.00–0.08)
Troponin i, poc: 0 ng/mL (ref 0.00–0.08)

## 2016-11-19 LAB — BASIC METABOLIC PANEL
ANION GAP: 8 (ref 5–15)
BUN: 16 mg/dL (ref 6–20)
CHLORIDE: 105 mmol/L (ref 101–111)
CO2: 27 mmol/L (ref 22–32)
Calcium: 9.9 mg/dL (ref 8.9–10.3)
Creatinine, Ser: 0.74 mg/dL (ref 0.44–1.00)
GFR calc Af Amer: >60 mL/min (ref >60)
GFR calc non Af Amer: >60 mL/min (ref >60)
GLUCOSE: 123 mg/dL — AB (ref 65–99)
POTASSIUM: 3.7 mmol/L (ref 3.5–5.1)
Sodium: 140 mmol/L (ref 135–145)

## 2016-11-19 LAB — CBC
HCT: 36 % (ref 36.0–46.0)
HEMOGLOBIN: 11.8 g/dL — AB (ref 12.0–15.0)
MCH: 29.9 pg (ref 26.0–34.0)
MCHC: 32.8 g/dL (ref 30.0–36.0)
MCV: 91.1 fL (ref 78.0–100.0)
Platelets: 153 10*3/uL (ref 150–400)
RBC: 3.95 MIL/uL (ref 3.87–5.11)
RDW: 13 % (ref 11.5–15.5)
WBC: 5.1 10*3/uL (ref 4.0–10.5)

## 2016-11-19 NOTE — ED Triage Notes (Addendum)
Pt reports L side CP with pain in neck and L arm as well intermittently for 2 weeks. Has also had body swelling. No SOB or weakness.

## 2016-11-19 NOTE — ED Provider Notes (Signed)
[facility]-EMERGENCY DEPT Provider Note   CSN: [id number] Arrival date & time: 11/19/16  1025     History   Chief Complaint Chief Complaint  Patient presents with  . Chest Pain    HPI Sandra Reed is a 71 y.o. female.  HPI   71 year old female with a history of GERD, hypertension, and hyperlipidemia presents today with complaints of chest pain.  Patient notes a significant past medical history of GERD for which she is taking omeprazole.  She has noted for the last several months she has had off and on pain in her chest with indigestion.  She notes the pain sometimes felt in her left shoulder and hand, and resolves on its own.  She notes both the chest pain and arm pain are sporadic and often times independent of each other.  She notes the chest pain is improved with belching, arm pain is improved with Tylenol.  He has been seen by her primary care provider for this, has been taking omeprazole, has a follow-up appointment with cardiology and gastroenterology.  Patient is in town visiting her daughter and was concerned with both the chest pain and arm pain together.  At the time of evaluation patient reports she is not having any concerning symptoms.  She denies any associated diaphoresis, abdominal pain.  Patient denies any prior cardiac history, non-smoker, no significant family cardiac history.  She does report she is diabetic, hypertensive, hyperlipidemia.     Past Medical History:  Diagnosis Date  . Diabetes mellitus without complication (HCC)   . GERD (gastroesophageal reflux disease)   . Thyroid disease     There are no active problems to display for this patient.   Past Surgical History:  Procedure Laterality Date  . ABDOMINAL HYSTERECTOMY      OB History    No data available      Home Medications    Prior to Admission medications   Medication Sig Start Date End Date Taking? Authorizing Provider  acetaminophen (TYLENOL) 650 MG CR tablet Take 650 mg by mouth every 8  (eight) hours as needed for pain.   Yes Historical Provider, MD  aspirin 81 MG chewable tablet Chew 81 mg by mouth at bedtime.    Yes Historical Provider, MD  gabapentin (NEURONTIN) 300 MG capsule Take 300 mg by mouth at bedtime.    Yes Historical Provider, MD  insulin glargine (LANTUS) 100 unit/mL SOPN Inject 45 Units into the skin at bedtime.   Yes Historical Provider, MD  levothyroxine (SYNTHROID, LEVOTHROID) 50 MCG tablet Take 50 mcg by mouth daily before breakfast.   Yes Historical Provider, MD  lisinopril-hydrochlorothiazide (PRINZIDE,ZESTORETIC) 20-25 MG tablet Take 1 tablet by mouth daily.    Yes Historical Provider, MD  metFORMIN (GLUCOPHAGE-XR) 500 MG 24 hr tablet Take 1,000 mg by mouth 2 (two) times daily.  08/27/16  Yes Historical Provider, MD  metoprolol (LOPRESSOR) 50 MG tablet Take 50 mg by mouth daily.    Yes Historical Provider, MD  montelukast (SINGULAIR) 10 MG tablet Take 1 tablet (10 mg total) by mouth at bedtime. Patient taking differently: Take 10 mg by mouth at bedtime as needed (for allergies).  07/27/16  Yes Sherren Mocha, MD  omeprazole (PRILOSEC) 40 MG capsule Take 40 mg by mouth daily.   Yes Historical Provider, MD  pentoxifylline (TRENTAL) 400 MG CR tablet Take 800 mg by mouth 2 (two) times daily with a meal.    Yes Historical Provider, MD  pravastatin (PRAVACHOL) 20 MG tablet Take 20  mg by mouth at bedtime.    Yes Historical Provider, MD  repaglinide (PRANDIN) 2 MG tablet Take 4 mg by mouth 3 (three) times daily before meals.    Yes Historical Provider, MD    Family History Family History  Problem Relation Age of Onset  . Diabetes Mother   . Hyperlipidemia Mother   . Hypertension Mother   . Cancer Father     Social History Social History  Substance Use Topics  . Smoking status: Never Smoker  . Smokeless tobacco: Never Used  . Alcohol use No     Allergies   Patient has no known allergies.   Review of Systems Review of Systems  All other systems reviewed  and are negative.   Physical Exam Updated Vital Signs BP 123/71 (BP Location: Right Arm)   Pulse 65   Temp 98.1 F (36.7 C) (Oral)   Resp 18   Wt 86.2 kg   SpO2 99%   BMI 30.67 kg/m   Physical Exam  Constitutional: She is oriented to person, place, and time. She appears well-developed and well-nourished.  HENT:  Head: Normocephalic and atraumatic.  Eyes: Conjunctivae are normal. Pupils are equal, round, and reactive to light. Right eye exhibits no discharge. Left eye exhibits no discharge. No scleral icterus.  Neck: Normal range of motion. No JVD present. No tracheal deviation present.  Cardiovascular: Normal rate, regular rhythm, normal heart sounds and intact distal pulses.   Pulmonary/Chest: Effort normal and breath sounds normal. No stridor. No respiratory distress. She has no wheezes. She has no rales. She exhibits no tenderness.  Musculoskeletal: Normal range of motion. She exhibits no edema.  Left shoulder and extremity nontender palpation full active range of motion, no CT or L-spine tenderness, tenderness to the posterior shoulder  Neurological: She is alert and oriented to person, place, and time. Coordination normal.  Psychiatric: She has a normal mood and affect. Her behavior is normal. Judgment and thought content normal.  Nursing note and vitals reviewed.    ED Treatments / Results  Labs (all labs ordered are listed, but only abnormal results are displayed) Labs Reviewed  BASIC METABOLIC PANEL - Abnormal; Notable for the following:       Result Value   Glucose, Bld 123 (*)    All other components within normal limits  CBC - Abnormal; Notable for the following:    Hemoglobin 11.8 (*)    All other components within normal limits  I-STAT TROPOININ, ED  I-STAT TROPOININ, ED    EKG  EKG Interpretation  Date/Time:  Monday November 19 2016 10:40:48 EST Ventricular Rate:  63 PR Interval:    QRS Duration: 85 QT Interval:  415 QTC Calculation: 425 R  Axis:   46 Text Interpretation:  Sinus rhythm Confirmed by Freida Busman  MD, ANTHONY ([id number]) on 11/19/2016 3:31:29 PM       Radiology Dg Chest 2 View  Result Date: 11/19/2016 CLINICAL DATA:  Two weeks of left-sided chest pain radiating into the neck and left arm. Sensation of body swelling. No shortness of breath. History of diabetes. Nonsmoker. EXAM: CHEST  2 VIEW COMPARISON:  None in PACs FINDINGS: The lungs are well-expanded. There is no focal infiltrate. There is no pleural effusion. The heart and pulmonary vascularity are normal. The mediastinum is normal in width. There is multilevel degenerative disc disease with prominent thoracic kyphosis. There is calcification in the wall of the aortic arch. IMPRESSION: No pneumonia, CHF, nor other acute cardiopulmonary abnormality. Thoracic aortic atherosclerosis. Electronically  Signed   By: David  Swazil[place] M.D.   On: 11/19/2016 11:05    Procedures Procedures (including critical care time)  Medications Ordered in ED Medications - No data to display   Initial Impression / Assessment and Plan / ED Course  I have reviewed the triage vital signs and the nursing notes.  Pertinent labs & imaging results that were available during my care of the patient were reviewed by me and considered in my medical decision making (see chart for details).     Final Clinical Impressions(s) / ED Diagnoses   Final diagnoses:  Indigestion  Chest pain, unspecified type  Pain of left upper extremity    Labs: Stat troponin, BMP, CBC  Imaging: Chest 2  Consults:  Therapeutics:  Discharge Meds:   Assessment/Plan: Patient presents today with complaints of chest pain.  This is been ongoing for greater than a month.  Patient has had primary care evaluation with scheduled cardiology and gastroenterology follow-up.  I have low suspicion for ACS in this patient, likely GERD as symptoms were improved with belching, and having no chest pain throughout my evaluation.  Patient  has negative delta troponin, reassuring EKG had no other significant findings.  Patient will be discharged home with cardiology follow-up strict return precautions.  She verbalized understanding and agreement to today's plan had no further questions or concerns at time of discharge      New Prescriptions Discharge Medication List as of 11/19/2016  3:52 PM       Eyvonne MechanicJeffrey Azoria Abbett, PA-C 11/19/16 2224

## 2016-11-19 NOTE — Discharge Instructions (Signed)
Please read attached information. If you experience any new or worsening signs or symptoms please return to the emergency room for evaluation. Please follow-up with your primary care provider or specialist as discussed. Please use previously prescribed medication only as directed and discontinue taking if you have any concerning signs or symptoms.   °

## 2016-11-19 NOTE — ED Notes (Signed)
EDPA Provider at bedside. PPT SITTING ON SIDE OF STRETCHER SPEAKING IN EDPA. PT WITHOUT COMPLAINT.

## 2016-11-19 NOTE — ED Notes (Signed)
Family at bedside. 

## 2016-11-19 NOTE — ED Provider Notes (Signed)
Medical screening examination/treatment/procedure(s) were conducted as a shared visit with non-physician practitioner(s) and myself.  I personally evaluated the patient during the encounter.   EKG Interpretation  Date/Time:  Monday November 19 2016 10:40:48 EST Ventricular Rate:  63 PR Interval:    QRS Duration: 85 QT Interval:  415 QTC Calculation: 425 R Axis:   46 Text Interpretation:  Sinus rhythm Confirmed by Freida BusmanALLEN  MD, Gwynne Kemnitz ([id number]) on 11/19/2016 3:31:29 PM     Patient here complaining of epigastric pain which she has had for several weeks that becomes worse after she eats. No associated other anginal type symptoms. Has had chronic left elbow pain for quite some time. Patient's delta troponin negative. A EKG was reviewed and is abnormal but she has a history of having when the past. Patient will follow-up with her Dr. for treatment of her likely GERD   Lorre NickAnthony Daren Doswell, MD 11/19/16 [id number]

## 2016-12-14 ENCOUNTER — Ambulatory Visit (INDEPENDENT_AMBULATORY_CARE_PROVIDER_SITE_OTHER): Payer: Medicare Other | Admitting: Physician Assistant

## 2016-12-14 VITALS — BP 122/72 | HR 73 | Temp 98.5°F | Resp 17 | Ht 65.5 in | Wt 191.0 lb

## 2016-12-14 DIAGNOSIS — M62838 Other muscle spasm: Secondary | ICD-10-CM | POA: Diagnosis not present

## 2016-12-14 DIAGNOSIS — M79632 Pain in left forearm: Secondary | ICD-10-CM

## 2016-12-14 MED ORDER — MELOXICAM 15 MG PO TABS: 15.0000 mg | ORAL_TABLET | Freq: Every day | ORAL | 1 refills | Status: DC

## 2016-12-14 NOTE — Patient Instructions (Addendum)
Stay well hydrated - this will help your muscles. Drink 1-2 liters of water/day.  Please get a heating pad and apply this to your muscles 2-3 times a day for 20 minutes at a time. Consider getting massages to help loosen your muscles.  Continue applying cream as needed to your muscles.  See below for stretches.  Consider in the future going to physical therapy for stretching/strengthening exercises and possible dry needling.  Acupuncture may help as well.  Come back if you are not better in 3-4 weeks.   Thank you for coming in today. I hope you feel we met your needs.  Feel free to call [facility] if you have any questions or further requests.  Please consider signing up for MyChart if you do not already have it, as this is a great way to communicate with me.  Best,  Whitney McVey, PA-C  Patient is to perform exercises below at 5 sets with 10 repetitions. Stretches are to be performed for 5 sets, 10 seconds each. Recommended she perform this rehab twice daily within pain tolerance for 2 weeks.  Cervical Strain and Sprain Rehab Ask your health care provider which exercises are safe for you. Do exercises exactly as told by your health care provider and adjust them as directed. It is normal to feel mild stretching, pulling, tightness, or discomfort as you do these exercises, but you should stop right away if you feel sudden pain or your pain gets worse.Do not begin these exercises until told by your health care provider. Stretching and range of motion exercises These exercises warm up your muscles and joints and improve the movement and flexibility of your neck. These exercises also help to relieve pain, numbness, and tingling. Exercise A: Cervical side bend   1. Using good posture, sit on a stable chair or stand up. 2. Without moving your shoulders, slowly tilt your left / right ear to your shoulder until you feel a stretch in your neck muscles. You should be looking straight ahead. 3. Hold for  __________ seconds. 4. Repeat with the other side of your neck. Repeat __________ times. Complete this exercise __________ times a day. Exercise B: Cervical rotation   1. Using good posture, sit on a stable chair or stand up. 2. Slowly turn your head to the side as if you are looking over your left / right shoulder.  Keep your eyes level with the ground.  Stop when you feel a stretch along the side and the back of your neck. 3. Hold for __________ seconds. 4. Repeat this by turning to your other side. Repeat __________ times. Complete this exercise __________ times a day. Exercise C: Thoracic extension and pectoral stretch  1. Roll a towel or a small blanket so it is about 4 inches (10 cm) in diameter. 2. Lie down on your back on a firm surface. 3. Put the towel lengthwise, under your spine in the middle of your back. It should not be not under your shoulder blades. The towel should line up with your spine from your middle back to your lower back. 4. Put your hands behind your head and let your elbows fall out to your sides. 5. Hold for __________ seconds. Repeat __________ times. Complete this exercise __________ times a day. Strengthening exercises These exercises build strength and endurance in your neck. Endurance is the ability to use your muscles for a long time, even after your muscles get tired. Exercise D: Upper cervical flexion, isometric  1. Lie on  your back with a thin pillow behind your head and a small rolled-up towel under your neck. 2. Gently tuck your chin toward your chest and nod your head down to look toward your feet. Do not lift your head off the pillow. 3. Hold for __________ seconds. 4. Release the tension slowly. Relax your neck muscles completely before you repeat this exercise. Repeat __________ times. Complete this exercise __________ times a day. Exercise E: Cervical extension, isometric   1. Stand about 6 inches (15 cm) away from a wall, with your back  facing the wall. 2. Place a soft object, about 6-8 inches (15-20 cm) in diameter, between the back of your head and the wall. A soft object could be a small pillow, a ball, or a folded towel. 3. Gently tilt your head back and press into the soft object. Keep your jaw and forehead relaxed. 4. Hold for __________ seconds. 5. Release the tension slowly. Relax your neck muscles completely before you repeat this exercise. Repeat __________ times. Complete this exercise __________ times a day. Posture and body mechanics   Body mechanics refers to the movements and positions of your body while you do your daily activities. Posture is part of body mechanics. Good posture and healthy body mechanics can help to relieve stress in your body's tissues and joints. Good posture means that your spine is in its natural S-curve position (your spine is neutral), your shoulders are pulled back slightly, and your head is not tipped forward. The following are general guidelines for applying improved posture and body mechanics to your everyday activities. Standing   When standing, keep your spine neutral and keep your feet about hip-width apart. Keep a slight bend in your knees. Your ears, shoulders, and hips should line up.  When you do a task in which you stand in one place for a long time, place one foot up on a stable object that is 2-4 inches (5-10 cm) high, such as a footstool. This helps keep your spine neutral. Sitting    When sitting, keep your spine neutral and your keep feet flat on the floor. Use a footrest, if necessary, and keep your thighs parallel to the floor. Avoid rounding your shoulders, and avoid tilting your head forward.  When working at a desk or a computer, keep your desk at a height where your hands are slightly lower than your elbows. Slide your chair under your desk so you are close enough to maintain good posture.  When working at a computer, place your monitor at a height where you are  looking straight ahead and you do not have to tilt your head forward or downward to look at the screen. Resting  When lying down and resting, avoid positions that are most painful for you. Try to support your neck in a neutral position. You can use a contour pillow or a small rolled-up towel. Your pillow should support your neck but not push on it. This information is not intended to replace advice given to you by your health care provider. Make sure you discuss any questions you have with your health care provider. Document Released: 09/10/2005 Document Revised: 05/17/2016 Document Reviewed: 08/17/2015 Elsevier Interactive Patient Education  2017 [employer].   IF you received an x-ray today, you will receive an invoice from [facility]. Please contact [facility] at [phone] with questions or concerns regarding your invoice.   IF you received labwork today, you will receive an invoice from [facility]. Please contact [facility] at  [phone] with questions or concerns regarding your invoice.   Our billing staff will not be able to assist you with questions regarding bills from these companies.  You will be contacted with the lab results as soon as they are available. The fastest way to get your results is to activate your My Chart account. Instructions are located on the last page of this paperwork. If you have not heard from [place] regarding the results in 2 weeks, please contact this office.

## 2016-12-14 NOTE — Progress Notes (Signed)
Sandra LenzSaundra Reed  MRN: [id number] DOB: 06/13/1946  PCP: No PCP Per Patient  Subjective:  Pt is a 71 year old right-handed female who presents to clinic for arm and knee pain x 2-3 months. Pain starts in her left ring finger and radiates up the top of her left forearm. C/o left-sided neck pain, worse with movement.  Nothing makes her left arm worse or better.  She sleeps on her right side. She tries not to carry her purse on her shoulder.  Denies joint swelling/tenderness/redness/warmth, reduced range of motion, stiffness, decreased grip strength, muscle weakness, chest pain, palpitations, SOB, dizziness.  She [place] in [place] - she is in the area visiting her daughter. Pt's husband passed away about 7 months ago.    Review of Systems  Musculoskeletal: Positive for arthralgias, myalgias and neck pain. Negative for joint swelling and neck stiffness.  Skin: Negative.   Psychiatric/Behavioral: Negative for sleep disturbance.    There are no active problems to display for this patient.   Current Outpatient Prescriptions on File Prior to Visit  Medication Sig Dispense Refill  . acetaminophen (TYLENOL) 650 MG CR tablet Take 650 mg by mouth every 8 (eight) hours as needed for pain.    Marland Kitchen. aspirin 81 MG chewable tablet Chew 81 mg by mouth at bedtime.     . gabapentin (NEURONTIN) 300 MG capsule Take 300 mg by mouth at bedtime.     . insulin glargine (LANTUS) 100 unit/mL SOPN Inject 45 Units into the skin at bedtime.    Marland Kitchen. levothyroxine (SYNTHROID, LEVOTHROID) 50 MCG tablet Take 50 mcg by mouth daily before breakfast.    . lisinopril-hydrochlorothiazide (PRINZIDE,ZESTORETIC) 20-25 MG tablet Take 1 tablet by mouth daily.     . metFORMIN (GLUCOPHAGE-XR) 500 MG 24 hr tablet Take 1,000 mg by mouth 2 (two) times daily.     . metoprolol (LOPRESSOR) 50 MG tablet Take 50 mg by mouth daily.     . montelukast (SINGULAIR) 10 MG tablet Take 1 tablet (10 mg total) by mouth at bedtime. (Patient taking  differently: Take 10 mg by mouth at bedtime as needed (for allergies). ) 30 tablet 3  . omeprazole (PRILOSEC) 40 MG capsule Take 40 mg by mouth daily.    . pentoxifylline (TRENTAL) 400 MG CR tablet Take 800 mg by mouth 2 (two) times daily with a meal.     . pravastatin (PRAVACHOL) 20 MG tablet Take 20 mg by mouth at bedtime.     . repaglinide (PRANDIN) 2 MG tablet Take 4 mg by mouth 3 (three) times daily before meals.      No current facility-administered medications on file prior to visit.     No Known Allergies   Objective:  BP 122/72   Pulse 73   Temp 98.5 F (36.9 C) (Oral)   Resp 17   Ht 5' 5.5" (1.664 m)   Wt 191 lb (86.6 kg)   SpO2 97%   BMI 31.30 kg/m   Physical Exam  Constitutional: She is oriented to person, place, and time and well-developed, well-nourished, and in no distress. No distress.  Cardiovascular: Normal rate, regular rhythm and normal heart sounds.   Musculoskeletal:       Cervical back: She exhibits tenderness (muscles of left neck) and spasm. She exhibits normal range of motion, no bony tenderness, no swelling, no deformity and no pain.       Left forearm: She exhibits no tenderness, no bony tenderness, no swelling, no deformity and no laceration.  Neurological: She is alert and oriented to person, place, and time. GCS score is 15.  Skin: Skin is warm and dry.  Psychiatric: Mood, memory, affect and judgment normal.  Vitals reviewed.   Assessment and Plan :  1. Muscle spasm 2. Left forearm pain - meloxicam (MOBIC) 15 MG tablet; Take 1 tablet (15 mg total) by mouth daily.  Dispense: 30 tablet; Refill: 1 - Suspect musculoskeletal pain due to stress. Pt's husband recently passed away, pain presented shortly after. Will treat supportively. Offered physical therapy with dry-needling, pt declined as she lives in [place]. Encouraged stretching, hydration, exercise, heat. RTC in 3-4 weeks if no improvement.      Marco Collie, PA-C  Primary Care at  [facility] 12/14/2016 6:20 PM

## 2016-12-18 ENCOUNTER — Observation Stay (HOSPITAL_COMMUNITY)
Admission: EM | Admit: 2016-12-18 | Discharge: 2016-12-19 | Disposition: A | Discharge status: Home / Self Care | Payer: Medicare Other | Attending: Internal Medicine | Admitting: Internal Medicine

## 2016-12-18 ENCOUNTER — Encounter (HOSPITAL_COMMUNITY): Payer: Self-pay | Admitting: Emergency Medicine

## 2016-12-18 ENCOUNTER — Emergency Department (HOSPITAL_COMMUNITY): Payer: Medicare Other

## 2016-12-18 ENCOUNTER — Observation Stay (HOSPITAL_BASED_OUTPATIENT_CLINIC_OR_DEPARTMENT_OTHER): Payer: Medicare Other

## 2016-12-18 DIAGNOSIS — I10 Essential (primary) hypertension: Secondary | ICD-10-CM

## 2016-12-18 DIAGNOSIS — I771 Stricture of artery: Secondary | ICD-10-CM | POA: Diagnosis not present

## 2016-12-18 DIAGNOSIS — R9431 Abnormal electrocardiogram [ECG] [EKG]: Secondary | ICD-10-CM | POA: Diagnosis not present

## 2016-12-18 DIAGNOSIS — K219 Gastro-esophageal reflux disease without esophagitis: Secondary | ICD-10-CM | POA: Diagnosis not present

## 2016-12-18 DIAGNOSIS — E119 Type 2 diabetes mellitus without complications: Secondary | ICD-10-CM | POA: Diagnosis not present

## 2016-12-18 DIAGNOSIS — E039 Hypothyroidism, unspecified: Secondary | ICD-10-CM | POA: Insufficient documentation

## 2016-12-18 DIAGNOSIS — I5189 Other ill-defined heart diseases: Secondary | ICD-10-CM | POA: Insufficient documentation

## 2016-12-18 DIAGNOSIS — Z79899 Other long term (current) drug therapy: Secondary | ICD-10-CM | POA: Diagnosis not present

## 2016-12-18 DIAGNOSIS — E1140 Type 2 diabetes mellitus with diabetic neuropathy, unspecified: Secondary | ICD-10-CM | POA: Insufficient documentation

## 2016-12-18 DIAGNOSIS — R110 Nausea: Secondary | ICD-10-CM | POA: Diagnosis not present

## 2016-12-18 DIAGNOSIS — R0789 Other chest pain: Secondary | ICD-10-CM | POA: Diagnosis not present

## 2016-12-18 DIAGNOSIS — Z6831 Body mass index (BMI) 31.0-31.9, adult: Secondary | ICD-10-CM | POA: Insufficient documentation

## 2016-12-18 DIAGNOSIS — E785 Hyperlipidemia, unspecified: Secondary | ICD-10-CM | POA: Insufficient documentation

## 2016-12-18 DIAGNOSIS — Z794 Long term (current) use of insulin: Secondary | ICD-10-CM | POA: Insufficient documentation

## 2016-12-18 DIAGNOSIS — Z7982 Long term (current) use of aspirin: Secondary | ICD-10-CM | POA: Insufficient documentation

## 2016-12-18 DIAGNOSIS — Z23 Encounter for immunization: Secondary | ICD-10-CM | POA: Diagnosis not present

## 2016-12-18 DIAGNOSIS — E118 Type 2 diabetes mellitus with unspecified complications: Secondary | ICD-10-CM | POA: Diagnosis not present

## 2016-12-18 DIAGNOSIS — E669 Obesity, unspecified: Secondary | ICD-10-CM | POA: Diagnosis not present

## 2016-12-18 DIAGNOSIS — R079 Chest pain, unspecified: Principal | ICD-10-CM | POA: Insufficient documentation

## 2016-12-18 LAB — ECHOCARDIOGRAM COMPLETE
HEIGHTINCHES: 65 in
Weight: 3056 oz

## 2016-12-18 LAB — BASIC METABOLIC PANEL
Anion gap: 9 (ref 5–15)
BUN: 16 mg/dL (ref 6–20)
CO2: 28 mmol/L (ref 22–32)
CREATININE: 0.82 mg/dL (ref 0.44–1.00)
Calcium: 9.5 mg/dL (ref 8.9–10.3)
Chloride: 99 mmol/L — ABNORMAL LOW (ref 101–111)
GFR calc non Af Amer: >60 mL/min (ref >60)
GLUCOSE: 194 mg/dL — AB (ref 65–99)
Potassium: 3.5 mmol/L (ref 3.5–5.1)
Sodium: 136 mmol/L (ref 135–145)

## 2016-12-18 LAB — I-STAT TROPONIN, ED: TROPONIN I, POC: 0 ng/mL (ref 0.00–0.08)

## 2016-12-18 LAB — TROPONIN I: Troponin I: <0.03 ng/mL (ref <0.03)

## 2016-12-18 LAB — CBC
HCT: 39 % (ref 36.0–46.0)
Hemoglobin: 12.8 g/dL (ref 12.0–15.0)
MCH: 29.6 pg (ref 26.0–34.0)
MCHC: 32.8 g/dL (ref 30.0–36.0)
MCV: 90.1 fL (ref 78.0–100.0)
PLATELETS: 182 10*3/uL (ref 150–400)
RBC: 4.33 MIL/uL (ref 3.87–5.11)
RDW: 12.9 % (ref 11.5–15.5)
WBC: 7.7 10*3/uL (ref 4.0–10.5)

## 2016-12-18 LAB — GLUCOSE, CAPILLARY
GLUCOSE-CAPILLARY: 108 mg/dL — AB (ref 65–99)
Glucose-Capillary: 136 mg/dL — ABNORMAL HIGH (ref 65–99)
Glucose-Capillary: 156 mg/dL — ABNORMAL HIGH (ref 65–99)
Glucose-Capillary: 149 mg/dL — ABNORMAL HIGH (ref 65–99)

## 2016-12-18 MED ORDER — HYDROCHLOROTHIAZIDE 25 MG PO TABS
25.0000 mg | ORAL_TABLET | Freq: Every day | ORAL | Status: DC
  Administered 2016-12-19: 25 mg via ORAL
  Filled 2016-12-18: qty 1

## 2016-12-18 MED ORDER — PENTOXIFYLLINE ER 400 MG PO TBCR
800.0000 mg | EXTENDED_RELEASE_TABLET | Freq: Two times a day (BID) | ORAL | Status: DC
  Administered 2016-12-18 – 2016-12-19 (×3): 800 mg via ORAL
  Filled 2016-12-18 (×4): qty 2

## 2016-12-18 MED ORDER — INSULIN ASPART 100 UNIT/ML ~~LOC~~ SOLN: 0.0000 [IU] | Freq: Every day | SUBCUTANEOUS | Status: DC

## 2016-12-18 MED ORDER — PNEUMOCOCCAL VAC POLYVALENT 25 MCG/0.5ML IJ INJ
0.5000 mL | INJECTION | INTRAMUSCULAR | Status: AC
  Administered 2016-12-19: 0.5 mL via INTRAMUSCULAR
  Filled 2016-12-18: qty 0.5

## 2016-12-18 MED ORDER — ASPIRIN 81 MG PO CHEW
81.0000 mg | CHEWABLE_TABLET | Freq: Every day | ORAL | Status: DC
  Administered 2016-12-18: 81 mg via ORAL
  Filled 2016-12-18: qty 1

## 2016-12-18 MED ORDER — ALUM & MAG HYDROXIDE-SIMETH 200-200-20 MG/5ML PO SUSP
30.0000 mL | ORAL | Status: DC | PRN
  Administered 2016-12-18: 30 mL via ORAL
  Filled 2016-12-18: qty 30

## 2016-12-18 MED ORDER — LISINOPRIL-HYDROCHLOROTHIAZIDE 20-25 MG PO TABS: 1.0000 | ORAL_TABLET | Freq: Every day | ORAL | Status: DC

## 2016-12-18 MED ORDER — ENOXAPARIN SODIUM 40 MG/0.4ML ~~LOC~~ SOLN
40.0000 mg | SUBCUTANEOUS | Status: DC
  Administered 2016-12-18 – 2016-12-19 (×2): 40 mg via SUBCUTANEOUS
  Filled 2016-12-18 (×2): qty 0.4

## 2016-12-18 MED ORDER — ACETAMINOPHEN ER 650 MG PO TBCR: 650.0000 mg | EXTENDED_RELEASE_TABLET | Freq: Three times a day (TID) | ORAL | Status: DC | PRN

## 2016-12-18 MED ORDER — PRAVASTATIN SODIUM 20 MG PO TABS
20.0000 mg | ORAL_TABLET | Freq: Every day | ORAL | Status: DC
  Administered 2016-12-18: 20 mg via ORAL
  Filled 2016-12-18: qty 1

## 2016-12-18 MED ORDER — INSULIN ASPART 100 UNIT/ML ~~LOC~~ SOLN
0.0000 [IU] | Freq: Three times a day (TID) | SUBCUTANEOUS | Status: DC
  Administered 2016-12-18: 2 [IU] via SUBCUTANEOUS
  Administered 2016-12-18: 1 [IU] via SUBCUTANEOUS
  Administered 2016-12-19: 2 [IU] via SUBCUTANEOUS

## 2016-12-18 MED ORDER — INSULIN GLARGINE 100 UNIT/ML ~~LOC~~ SOLN
30.0000 [IU] | Freq: Every day | SUBCUTANEOUS | Status: DC
  Administered 2016-12-18: 30 [IU] via SUBCUTANEOUS
  Filled 2016-12-18 (×2): qty 0.3

## 2016-12-18 MED ORDER — GABAPENTIN 300 MG PO CAPS
300.0000 mg | ORAL_CAPSULE | Freq: Every day | ORAL | Status: DC
  Administered 2016-12-18: 300 mg via ORAL
  Filled 2016-12-18: qty 1

## 2016-12-18 MED ORDER — ACETAMINOPHEN 325 MG PO TABS: 650.0000 mg | ORAL_TABLET | ORAL | Status: DC | PRN

## 2016-12-18 MED ORDER — LEVOTHYROXINE SODIUM 50 MCG PO TABS
50.0000 ug | ORAL_TABLET | Freq: Every day | ORAL | Status: DC
  Administered 2016-12-18 – 2016-12-19 (×2): 50 ug via ORAL
  Filled 2016-12-18 (×2): qty 1

## 2016-12-18 MED ORDER — PANTOPRAZOLE SODIUM 40 MG PO TBEC
40.0000 mg | DELAYED_RELEASE_TABLET | Freq: Every day | ORAL | Status: DC
  Administered 2016-12-18 – 2016-12-19 (×2): 40 mg via ORAL
  Filled 2016-12-18 (×2): qty 1

## 2016-12-18 MED ORDER — ONDANSETRON HCL 4 MG/2ML IJ SOLN: 4.0000 mg | Freq: Four times a day (QID) | INTRAMUSCULAR | Status: DC | PRN

## 2016-12-18 MED ORDER — LISINOPRIL 20 MG PO TABS
20.0000 mg | ORAL_TABLET | Freq: Every day | ORAL | Status: DC
  Administered 2016-12-19: 20 mg via ORAL
  Filled 2016-12-18: qty 1

## 2016-12-18 NOTE — H&P (Signed)
History and Physical    Sandra Reed JXB:[id number] DOB: [birth year] DOA: 12/18/2016  I have briefly reviewed the patient's prior medical records in [facility]  PCP: No PCP Per Patient her PCP is in [place], [place] Patient coming from: Home  Chief Complaint: Chest pain  HPI: Sandra Reed is a 71 y.o. female with medical history significant of type 2 diabetes mellitus on insulin, GERD, hypothyroidism, hypertension, presents to the emergency room with a chief complaint of chest pain.  Patient is somewhat of a poor historian, however tells me that over the last 3 months she has been having intermittent midsternal chest pain radiating under her left wrist.  Sometimes this pain is associated with food and sometimes it is not.  She has not noticed a consistent association with exercise as well, "sometimes" it is and sometimes is not.  She also has been having intermittent nausea along with chest pain.  He denies any abdominal pain, no diarrhea or constipation.  She has no palpitations.  She describes the pain as tightness.  She has brought this to the attention of her PCP, and he has plan a 2D echo as well as a stress test next week.  When she woke up this morning, her pain was so severe and she also felt short of breath, she panicked and decided to come to the emergency room.  She denies any cough or chest congestion or URI symptoms.  Prior to the 3 months she has been having this intermittent chest pains, she never had this problem.  This is her second ED visit for the same problem.  ED Course: In the emergency room, initial troponin is negative, blood work is essentially unremarkable.  Chest x-ray is negative.  EKG with possible very subtle ST segment changes compared to the EKG last month.  Review of Systems: As per HPI otherwise 10 point review of systems negative.   Past Medical History:  Diagnosis Date  . Diabetes mellitus without complication (HCC)   . GERD (gastroesophageal  reflux disease)   . Thyroid disease     Past Surgical History:  Procedure Laterality Date  . ABDOMINAL HYSTERECTOMY       reports that she has never smoked. She has never used smokeless tobacco. She reports that she does not drink alcohol or use drugs.  No Known Allergies  Family History  Problem Relation Age of Onset  . Diabetes Mother   . Hyperlipidemia Mother   . Hypertension Mother   . Cancer Father     Prior to Admission medications   Medication Sig Start Date End Date Taking? Authorizing Provider  acetaminophen (TYLENOL) 650 MG CR tablet Take 650 mg by mouth every 8 (eight) hours as needed for pain.   Yes Historical Provider, MD  aspirin 81 MG chewable tablet Chew 81 mg by mouth at bedtime.    Yes Historical Provider, MD  gabapentin (NEURONTIN) 300 MG capsule Take 300 mg by mouth at bedtime.    Yes Historical Provider, MD  insulin glargine (LANTUS) 100 unit/mL SOPN Inject 45 Units into the skin at bedtime.   Yes Historical Provider, MD  levothyroxine (SYNTHROID, LEVOTHROID) 50 MCG tablet Take 50 mcg by mouth daily before breakfast.   Yes Historical Provider, MD  lisinopril-hydrochlorothiazide (PRINZIDE,ZESTORETIC) 20-25 MG tablet Take 1 tablet by mouth daily.    Yes Historical Provider, MD  metFORMIN (GLUCOPHAGE-XR) 500 MG 24 hr tablet Take 1,000 mg by mouth 2 (two) times daily.  08/27/16  Yes Historical Provider, MD  metoprolol (LOPRESSOR) 50 MG tablet Take 50 mg by mouth daily.    Yes Historical Provider, MD  montelukast (SINGULAIR) 10 MG tablet Take 1 tablet (10 mg total) by mouth at bedtime. Patient taking differently: Take 10 mg by mouth at bedtime as needed (for allergies).  07/27/16  Yes Sherren Mocha, MD  omeprazole (PRILOSEC) 40 MG capsule Take 40 mg by mouth daily.   Yes Historical Provider, MD  pentoxifylline (TRENTAL) 400 MG CR tablet Take 800 mg by mouth 2 (two) times daily with a meal.    Yes Historical Provider, MD  pravastatin (PRAVACHOL) 20 MG tablet Take 20 mg by  mouth at bedtime.    Yes Historical Provider, MD  repaglinide (PRANDIN) 2 MG tablet Take 4 mg by mouth 3 (three) times daily before meals.    Yes Historical Provider, MD  meloxicam (MOBIC) 15 MG tablet Take 1 tablet (15 mg total) by mouth daily. Patient not taking: Reported on 12/18/2016 12/14/16   Madelaine Bhat McVey, PA-C    Physical Exam: Vitals:   12/18/16 0519  BP: 137/77  Pulse: 74  Resp: 14  Temp: 97.6 F (36.4 C)  TempSrc: Oral  SpO2: 100%  Weight: 86.6 kg (191 lb)  Height: 5\' 5"  (1.651 m)      Constitutional: NAD, calm, comfortable Vitals:   12/18/16 0519  BP: 137/77  Pulse: 74  Resp: 14  Temp: 97.6 F (36.4 C)  TempSrc: Oral  SpO2: 100%  Weight: 86.6 kg (191 lb)  Height: 5\' 5"  (1.651 m)   Eyes: PERRL, lids and conjunctivae normal ENMT: Mucous membranes are moist. Posterior pharynx clear of any exudate or lesions. Neck: normal, supple, no masses, no thyromegaly Respiratory: clear to auscultation bilaterally, no wheezing, no crackles. Normal respiratory effort. No accessory muscle use.  Cardiovascular: Regular rate and rhythm, no murmurs / rubs / gallops. No extremity edema. 2+ pedal pulses.  Abdomen: no tenderness, no masses palpated. Bowel sounds positive.  Musculoskeletal: no clubbing / cyanosis. Normal muscle tone.  Skin: no rashes, lesions, ulcers. No induration Neurologic: CN 2-12 grossly intact. Strength 5/5 in all 4.  Psychiatric: Normal judgment and insight. Alert and oriented x 3. Normal mood.   Labs on Admission: I have personally reviewed following labs and imaging studies  CBC:  Recent Labs Lab 12/18/16 0530  WBC 7.7  HGB 12.8  HCT 39.0  MCV 90.1  PLT 182   Basic Metabolic Panel:  Recent Labs Lab 12/18/16 0530  NA 136  K 3.5  CL 99*  CO2 28  GLUCOSE 194*  BUN 16  CREATININE 0.82  CALCIUM 9.5   GFR: Estimated Creatinine Clearance (by C-G formula based on SCr of 0.82 mg/dL) Female: 40.9 mL/min Female: 84.8 mL/min Liver  Function Tests: No results for input(s): AST, ALT, ALKPHOS, BILITOT, PROT, ALBUMIN in the last 168 hours. No results for input(s): LIPASE, AMYLASE in the last 168 hours. No results for input(s): AMMONIA in the last 168 hours. Coagulation Profile: No results for input(s): INR, PROTIME in the last 168 hours. Cardiac Enzymes: No results for input(s): CKTOTAL, CKMB, CKMBINDEX, TROPONINI in the last 168 hours. BNP (last 3 results) No results for input(s): PROBNP in the last 8760 hours. HbA1C: No results for input(s): HGBA1C in the last 72 hours. CBG: No results for input(s): GLUCAP in the last 168 hours. Lipid Profile: No results for input(s): CHOL, HDL, LDLCALC, TRIG, CHOLHDL, LDLDIRECT in the last 72 hours. Thyroid Function Tests: No results for input(s): TSH, T4TOTAL, FREET4, T3FREE,  THYROIDAB in the last 72 hours. Anemia Panel: No results for input(s): VITAMINB12, FOLATE, FERRITIN, TIBC, IRON, RETICCTPCT in the last 72 hours. Urine analysis: No results found for: COLORURINE, APPEARANCEUR, LABSPEC, PHURINE, GLUCOSEU, HGBUR, BILIRUBINUR, KETONESUR, PROTEINUR, UROBILINOGEN, NITRITE, LEUKOCYTESUR   Radiological Exams on Admission: Dg Chest 2 View  Result Date: 12/18/2016 CLINICAL DATA:  Left-sided chest pain. EXAM: CHEST  2 VIEW COMPARISON:  11/19/2016 FINDINGS: The cardiomediastinal contours are normal. Mild central bronchitic change. Pulmonary vasculature is normal. No consolidation, pleural effusion, or pneumothorax. No acute osseous abnormalities are seen. IMPRESSION: Bronchitic changes. Electronically Signed   By: Rubye OaksMelanie  Ehinger M.D.   On: 12/18/2016 05:51    EKG: Independently reviewed.  Sinus rhythm, subtle ST segment changes in V4 5  Assessment/Plan Active Problems:   Chest pain   Diabetes mellitus (HCC)   HTN (hypertension)   GERD (gastroesophageal reflux disease)    Chest pain -Sorting out reflux versus true chest pain is difficult as she offers a somewhat of an  inconsistent story, however she is diabetic, 71 years old, and may have an anginal equivalent which is atypical -We will admit for observation, check serial cardiac enzymes, check a 2D echo -We will consult cardiology, appreciate input, keep n.p.o.  Diabetes mellitus -Patient states that most recent hemoglobin A1c was in the mid sevenths, recheck 1 -Resume her home Lantus at a lower dose, place patient on sliding scale insulin  Hypertension -Resume home medications  GERD -Resume PPI     DVT prophylaxis: Lovenox Code Status: Full code Family Communication: Discussed with daughter in the room Disposition Plan: Admit to telemetry Consults called: Cardiology    Admission status: Observation  At the point of initial evaluation, it is my clinical opinion that admission for OBSERVATION is reasonable and necessary because the patient's presenting complaints in the context of their chronic conditions represent sufficient risk of deterioration or significant morbidity to constitute reasonable grounds for close observation in the hospital setting, but that the patient may be medically stable for discharge from the hospital within 24 to 48 hours.    Pamella Pertostin Livi Mcgann, MD [facility] Pager [phone]  If 7PM-7AM, please contact night-coverage [email or web address] Password [email or web address]  12/18/2016, 7:20 AM

## 2016-12-18 NOTE — ED Provider Notes (Signed)
[facility]-EMERGENCY DEPT Provider Note   CSN: [id number] Arrival date & time: 12/18/16  0510     History   Chief Complaint Chief Complaint  Patient presents with  . Chest Pain    HPI Sandra Reed is a 71 y.o. female.  HPI   Patient is a 71 year old female with history of hypertension, hyperlipidemia, diabetes and GERD who presents to the ED with complaint of chest pain, onset 8 PM last night. Pt reports she was sitting at home resting when she started having intermittent sharp left sided chest pain that wound last appx 5-10 minutes at a time. Endorses associated SOB and chest tightness. Pt reports having episodes of chest pain over the past 2-3 months but notes her episode last night was different and worse in severity. Denies fever, headache, dizziness, cough, palpitations, wheezing, abdominal pain, vomiting, numbness, weakness. Patient denies taking any medications for her symptoms. Endorses taking aspirin daily. Denies personal or family history of cardiac disease.  Past Medical History:  Diagnosis Date  . Diabetes mellitus without complication (HCC)   . GERD (gastroesophageal reflux disease)   . Thyroid disease     Patient Active Problem List   Diagnosis Date Noted  . Chest pain 12/18/2016    Past Surgical History:  Procedure Laterality Date  . ABDOMINAL HYSTERECTOMY      OB History    No data available       Home Medications    Prior to Admission medications   Medication Sig Start Date End Date Taking? Authorizing Provider  acetaminophen (TYLENOL) 650 MG CR tablet Take 650 mg by mouth every 8 (eight) hours as needed for pain.   Yes Historical Provider, MD  aspirin 81 MG chewable tablet Chew 81 mg by mouth at bedtime.    Yes Historical Provider, MD  gabapentin (NEURONTIN) 300 MG capsule Take 300 mg by mouth at bedtime.    Yes Historical Provider, MD  insulin glargine (LANTUS) 100 unit/mL SOPN Inject 45 Units into the skin at bedtime.   Yes Historical Provider, MD    levothyroxine (SYNTHROID, LEVOTHROID) 50 MCG tablet Take 50 mcg by mouth daily before breakfast.   Yes Historical Provider, MD  lisinopril-hydrochlorothiazide (PRINZIDE,ZESTORETIC) 20-25 MG tablet Take 1 tablet by mouth daily.    Yes Historical Provider, MD  metFORMIN (GLUCOPHAGE-XR) 500 MG 24 hr tablet Take 1,000 mg by mouth 2 (two) times daily.  08/27/16  Yes Historical Provider, MD  metoprolol (LOPRESSOR) 50 MG tablet Take 50 mg by mouth daily.    Yes Historical Provider, MD  montelukast (SINGULAIR) 10 MG tablet Take 1 tablet (10 mg total) by mouth at bedtime. Patient taking differently: Take 10 mg by mouth at bedtime as needed (for allergies).  07/27/16  Yes Sherren MochaEva N Shaw, MD  omeprazole (PRILOSEC) 40 MG capsule Take 40 mg by mouth daily.   Yes Historical Provider, MD  pentoxifylline (TRENTAL) 400 MG CR tablet Take 800 mg by mouth 2 (two) times daily with a meal.    Yes Historical Provider, MD  pravastatin (PRAVACHOL) 20 MG tablet Take 20 mg by mouth at bedtime.    Yes Historical Provider, MD  repaglinide (PRANDIN) 2 MG tablet Take 4 mg by mouth 3 (three) times daily before meals.    Yes Historical Provider, MD  meloxicam (MOBIC) 15 MG tablet Take 1 tablet (15 mg total) by mouth daily. Patient not taking: Reported on 12/18/2016 12/14/16   Madelaine BhatElizabeth Whitney McVey, PA-C    Family History Family History  Problem Relation Age  of Onset  . Diabetes Mother   . Hyperlipidemia Mother   . Hypertension Mother   . Cancer Father     Social History Social History  Substance Use Topics  . Smoking status: Never Smoker  . Smokeless tobacco: Never Used  . Alcohol use No     Allergies   Patient has no known allergies.   Review of Systems Review of Systems  Respiratory: Positive for chest tightness and shortness of breath.   Cardiovascular: Positive for chest pain.  All other systems reviewed and are negative.    Physical Exam Updated Vital Signs BP 137/77 (BP Location: Left Arm)   Pulse 74    Temp 97.6 F (36.4 C) (Oral)   Resp 14   Ht 5\' 5"  (1.651 m)   Wt 86.6 kg   SpO2 100%   BMI 31.78 kg/m   Physical Exam  Constitutional: She is oriented to person, place, and time. She appears well-developed and well-nourished. No distress.  HENT:  Head: Normocephalic and atraumatic.  Mouth/Throat: Uvula is midline, oropharynx is clear and moist and mucous membranes are normal. No oropharyngeal exudate, posterior oropharyngeal edema, posterior oropharyngeal erythema or tonsillar abscesses. No tonsillar exudate.  Eyes: Conjunctivae and EOM are normal. Right eye exhibits no discharge. Left eye exhibits no discharge. No scleral icterus.  Neck: Normal range of motion. Neck supple.  Cardiovascular: Normal rate, regular rhythm, normal heart sounds and intact distal pulses.   Pulmonary/Chest: Effort normal and breath sounds normal. No respiratory distress. She has no wheezes. She has no rales. She exhibits no tenderness.  Abdominal: Soft. Bowel sounds are normal. She exhibits no distension and no mass. There is no tenderness. There is no rebound and no guarding. No hernia.  Musculoskeletal: Normal range of motion. She exhibits no edema.  Neurological: She is alert and oriented to person, place, and time.  Skin: Skin is warm and dry. She is not diaphoretic.  Nursing note and vitals reviewed.    ED Treatments / Results  Labs (all labs ordered are listed, but only abnormal results are displayed) Labs Reviewed  BASIC METABOLIC PANEL - Abnormal; Notable for the following:       Result Value   Chloride 99 (*)    Glucose, Bld 194 (*)    All other components within normal limits  CBC  I-STAT TROPOININ, ED    EKG  EKG Interpretation  Date/Time:  Tuesday December 18 2016 05:17:16 EDT Ventricular Rate:  69 PR Interval:    QRS Duration: 93 QT Interval:  380 QTC Calculation: 408 R Axis:   45 Text Interpretation:  Sinus rhythm Ventricular premature complex non specific st t changes  Reconfirmed by [facility]  MD, APRIL ([id number]) on 12/18/2016 6:20:31 AM       Radiology Dg Chest 2 View  Result Date: 12/18/2016 CLINICAL DATA:  Left-sided chest pain. EXAM: CHEST  2 VIEW COMPARISON:  11/19/2016 FINDINGS: The cardiomediastinal contours are normal. Mild central bronchitic change. Pulmonary vasculature is normal. No consolidation, pleural effusion, or pneumothorax. No acute osseous abnormalities are seen. IMPRESSION: Bronchitic changes. Electronically Signed   By: Rubye Oaks M.D.   On: 12/18/2016 05:51    Procedures Procedures (including critical care time)  Medications Ordered in ED Medications - No data to display   Initial Impression / Assessment and Plan / ED Course  I have reviewed the triage vital signs and the nursing notes.  Pertinent labs & imaging results that were available during my care of the patient were reviewed  by me and considered in my medical decision making (see chart for details).     Patient presents with intermittent chest pain and associated chest tightness and shortness of breath that started last night. Reports having intermittent episodes of chest pain for the past 2-3 months but notes her episode last night was different and worse in severity. Pt denies currently having any chest pain. VSS. Exam unremarkable. EKG showed sinus rhythm with nonspecific ST T changes. Trop negative. CXR negative. Labs unremarkable. HEART score 5. Discussed pt with Dr. Nicanor Alcon who also evaluated pt in the ED. Plan to admit pt for cardiac rule out. Consulted hospitalist.   Final Clinical Impressions(s) / ED Diagnoses   Final diagnoses:  Other chest pain    New Prescriptions New Prescriptions   No medications on file     Barrett Henle, Cordelia Poche 12/18/16 0703    April Palumbo, MD 12/29/16 [phone]

## 2016-12-18 NOTE — Progress Notes (Signed)
[facility] arranged to pick up pt  to transport tom for stress test scheduled @ 0830  @ [place].

## 2016-12-18 NOTE — Progress Notes (Signed)
  Echocardiogram 2D Echocardiogram has been performed.  Sandra Reed, Sandra Reed 12/18/2016, 4:00 PM

## 2016-12-18 NOTE — ED Triage Notes (Signed)
PT is c/o chest pain that started about an hour ago  Pt states she feels like she is shaking all over  Pt states she has pain in her left arm and neck but states that she has had that before  Pt also states she has ringing in her left ear

## 2016-12-18 NOTE — Consult Note (Signed)
Cardiology Consult    Patient ID: Sandra Reed MRN: [id number], DOB/AGE: 12-21-1945   Admit date: 12/18/2016 Date of Consult: 12/18/2016  Primary Physician: No PCP Per Patient Primary Cardiologist: New Requesting Provider: Elvera Lennox Reason for Consultation: Chest pain  Patient Profile    71 yo female with PMH of IDDM, GERD, hypothyroidism, HTN who presented with chest pain.   Past Medical History   Past Medical History:  Diagnosis Date  . Diabetes mellitus without complication (HCC)   . GERD (gastroesophageal reflux disease)   . Thyroid disease     Past Surgical History:  Procedure Laterality Date  . ABDOMINAL HYSTERECTOMY       Allergies  No Known Allergies  History of Present Illness    Sandra Reed is a 71 yo female with PMH of  IDDM, GERD, hypothyroidism, HTN. Reports she currently lives in [place] and sees her PCP and a cardiologist there. Has had an echo and stress test back in 2015 that was normal. Reports she has been having intermittent episodes of chest pressure, with burning and radiation into the left arm for the past couple of months. Associated with both rest and exertion, many times worse after eating. Reports normally takes Prilosec and this helps with symptoms at times, sometimes tylenol. Saw her cardiologist and was scheduled to have an echo and stress test next week. Reports she woke up this morning and had severe chest pressure and burning, with some dyspnea. This prompted her to come to the ED.   In the ED her labs showed stable electrolytes, Trop negx2, Hgb 12.8. CXR negative. EKG showed SR with new nonspecific T wave changes in the anterolateral leads. She was admitted for further chest pain work up.   Inpatient Medications    . aspirin  81 mg Oral QHS  . enoxaparin (LOVENOX) injection  40 mg Subcutaneous Q24H  . gabapentin  300 mg Oral QHS  . [START ON 12/19/2016] lisinopril  20 mg Oral Daily   And  . [START ON 12/19/2016] hydrochlorothiazide  25  mg Oral Daily  . insulin aspart  0-5 Units Subcutaneous QHS  . insulin aspart  0-9 Units Subcutaneous TID WC  . insulin glargine  30 Units Subcutaneous QHS  . levothyroxine  50 mcg Oral QAC breakfast  . pantoprazole  40 mg Oral Daily  . pentoxifylline  800 mg Oral BID WC  . [START ON 12/19/2016] pneumococcal 23 valent vaccine  0.5 mL Intramuscular Tomorrow-1000  . pravastatin  20 mg Oral QHS    Family History    Family History  Problem Relation Age of Onset  . Diabetes Mother   . Hyperlipidemia Mother   . Hypertension Mother   . Cancer Father     Social History    Social History   Social History  . Marital status: Widowed    Spouse name: N/A  . Number of children: N/A  . Years of education: N/A   Occupational History  . Not on file.   Social History Main Topics  . Smoking status: Never Smoker  . Smokeless tobacco: Never Used  . Alcohol use No  . Drug use: No  . Sexual activity: No   Other Topics Concern  . Not on file   Social History Narrative  . No narrative on file     Review of Systems    General:  No chills, fever, night sweats or weight changes.  Cardiovascular: See HPI Dermatological: No rash, lesions/masses Respiratory: No cough, dyspnea Urologic: No hematuria,  dysuria Abdominal:   No nausea, vomiting, diarrhea, bright red blood per rectum, melena, or hematemesis Neurologic:  No visual changes, wkns, changes in mental status. All other systems reviewed and are otherwise negative except as noted above.  Physical Exam    Blood pressure 109/73, pulse 66, temperature 98.2 F (36.8 C), temperature source Oral, resp. rate 14, height 5\' 5"  (1.651 m), weight 191 lb (86.6 kg), SpO2 95 %.  General: Sandra Reed, NAD Psych: Normal affect. Neuro: Alert and oriented X 3. Moves all extremities spontaneously. HEENT: Normal  Neck: Supple without bruits or JVD. Lungs:  Resp regular and unlabored, CTA. Heart: RRR no s3, s4, or murmurs. Abdomen: Soft,  non-tender, non-distended, BS + x 4.  Extremities: No clubbing, cyanosis or edema. DP/PT/Radials 2+ and equal bilaterally.  Labs    Troponin [facility](Point of Care Test)  Recent Labs  12/18/16 0539  TROPIPOC 0.00    Recent Labs  12/18/16 0802  TROPONINI <0.03   Lab Results  Component Value Date   WBC 7.7 12/18/2016   HGB 12.8 12/18/2016   HCT 39.0 12/18/2016   MCV 90.1 12/18/2016   PLT 182 12/18/2016    Recent Labs Lab 12/18/16 0530  NA 136  K 3.5  CL 99*  CO2 28  BUN 16  CREATININE 0.82  CALCIUM 9.5  GLUCOSE 194*   No results found for: CHOL, HDL, LDLCALC, TRIG No results found for: [facility]   Radiology Studies    Dg Chest 2 View  Result Date: 12/18/2016 CLINICAL DATA:  Left-sided chest pain. EXAM: CHEST  2 VIEW COMPARISON:  11/19/2016 FINDINGS: The cardiomediastinal contours are normal. Mild central bronchitic change. Pulmonary vasculature is normal. No consolidation, pleural effusion, or pneumothorax. No acute osseous abnormalities are seen. IMPRESSION: Bronchitic changes. Electronically Signed   By: Rubye OaksMelanie  Ehinger M.D.   On: 12/18/2016 05:51   Dg Chest 2 View  Result Date: 11/19/2016 CLINICAL DATA:  Two weeks of left-sided chest pain radiating into the neck and left arm. Sensation of body swelling. No shortness of breath. History of diabetes. Nonsmoker. EXAM: CHEST  2 VIEW COMPARISON:  None in PACs FINDINGS: The lungs are well-expanded. There is no focal infiltrate. There is no pleural effusion. The heart and pulmonary vascularity are normal. The mediastinum is normal in width. There is multilevel degenerative disc disease with prominent thoracic kyphosis. There is calcification in the wall of the aortic arch. IMPRESSION: No pneumonia, CHF, nor other acute cardiopulmonary abnormality. Thoracic aortic atherosclerosis. Electronically Signed   By: Kathye Cipriani  [place] M.D.   On: 11/19/2016 11:05    ECG & Cardiac Imaging    EKG: SR with nonspecific T wave changes in  anterolateral leads  Echo: Pending  Assessment & Plan    71 yo female with PMH of IDDM, GERD, hypothyroidism, HTN who presented with chest pain.  1. Chest pain with Moderate Risk for Cardiac Etiology: Symptoms are somewhat atypical in nature. Present both with rest and activity, worse after eating. Sometimes relieved with PPI, and tylenol. Sometimes described as musculoskeletal in nature. EKG does show slight T wave changes, but trop negative thus far.  -- plan for 2D echo, and lexiscan myoview tomorrow as she does have RF including HTN, HL and IDDM.    -- consider GI causes for chest pain as well  2. HTN: Controlled on admission  3. HL: on statin  4. IDDM: has been on lantus for about 5 years. Hgb A1c pending  5. GERD: on PPI  Signed, Lillia AbedLindsay  Su Hilt, NP-C Pager [phone] 12/18/2016, 10:24 AM    I have seen, examined and evaluated the patient this PM along with Geoffry Paradise, NP.  After reviewing all the available data and chart, we discussed the patients laboratory, study & physical findings as well as symptoms in detail. I agree with her findings, examination as well as impression recommendations as per our discussion.    She is a very Sandra 71 year old woman here visiting her daughter (she usually lives in [place], [place]).  She was admitted with features are somewhat typical as well as atypical for angina. She describes a heaviness that goes into her left shoulder and arm, but also describes a more bloating gas-like sensation that is across her entire chest. This is more relieved with certain movements or belching. The discomfort persists despite exertion, and not exacerbated by exertion. It occurs at rest and continues with exertion.  Concerning features are the discomfort going down her left arm, and her risk factors including diabetes mellitus, hypertension, hyperlipidemia and obesity.  Given her risk factors, this discomfort is at least moderate risk for cardiac  etiology and warts ischemic evaluation. She'll be scheduled for a Myoview stress test tomorrow.  More recommendations pending results of Myoview.   Bryan Lemma, M.D., M.S. Interventional Cardiologist   Pager # [phone] Phone # [phone] [address] [place], [place] [address]

## 2016-12-19 ENCOUNTER — Ambulatory Visit (HOSPITAL_BASED_OUTPATIENT_CLINIC_OR_DEPARTMENT_OTHER)
Admit: 2016-12-19 | Discharge: 2016-12-19 | Disposition: A | Discharge status: Home / Self Care | Payer: Medicare Other | Attending: Cardiology | Admitting: Cardiology

## 2016-12-19 DIAGNOSIS — Z794 Long term (current) use of insulin: Secondary | ICD-10-CM

## 2016-12-19 DIAGNOSIS — R0789 Other chest pain: Secondary | ICD-10-CM | POA: Diagnosis not present

## 2016-12-19 DIAGNOSIS — E785 Hyperlipidemia, unspecified: Secondary | ICD-10-CM | POA: Diagnosis not present

## 2016-12-19 DIAGNOSIS — K219 Gastro-esophageal reflux disease without esophagitis: Secondary | ICD-10-CM | POA: Diagnosis not present

## 2016-12-19 DIAGNOSIS — E039 Hypothyroidism, unspecified: Secondary | ICD-10-CM

## 2016-12-19 DIAGNOSIS — I10 Essential (primary) hypertension: Secondary | ICD-10-CM | POA: Diagnosis not present

## 2016-12-19 DIAGNOSIS — E118 Type 2 diabetes mellitus with unspecified complications: Secondary | ICD-10-CM

## 2016-12-19 DIAGNOSIS — R079 Chest pain, unspecified: Secondary | ICD-10-CM | POA: Diagnosis not present

## 2016-12-19 LAB — GLUCOSE, CAPILLARY
GLUCOSE-CAPILLARY: 140 mg/dL — AB (ref 65–99)
Glucose-Capillary: 187 mg/dL — ABNORMAL HIGH (ref 65–99)

## 2016-12-19 LAB — NM MYOCAR MULTI W/SPECT W/WALL MOTION / EF
CHL CUP NUCLEAR SRS: 5
CSEPEW: 1 METS
CSEPPHR: 116 {beats}/min
Exercise duration (min): 6 min
Exercise duration (sec): 56 s
LV dias vol: 64 mL (ref 46–106)
LV sys vol: 24 mL
MPHR: 150 {beats}/min
NUC STRESS TID: 0.78
Percent HR: 77 %
RATE: 0
Rest HR: 72 {beats}/min
SDS: 1
SSS: 6

## 2016-12-19 LAB — CBC WITH DIFFERENTIAL/PLATELET
BASOS ABS: 0 10*3/uL (ref 0.0–0.1)
Basophils Relative: 0 %
EOS ABS: 0 10*3/uL (ref 0.0–0.7)
EOS PCT: 1 %
HCT: 39.8 % (ref 36.0–46.0)
Hemoglobin: 12.9 g/dL (ref 12.0–15.0)
LYMPHS ABS: 1.4 10*3/uL (ref 0.7–4.0)
Lymphocytes Relative: 30 %
MCH: 29.6 pg (ref 26.0–34.0)
MCHC: 32.4 g/dL (ref 30.0–36.0)
MCV: 91.3 fL (ref 78.0–100.0)
Monocytes Absolute: 0.5 10*3/uL (ref 0.1–1.0)
Monocytes Relative: 10 %
Neutro Abs: 2.8 10*3/uL (ref 1.7–7.7)
Neutrophils Relative %: 59 %
PLATELETS: 160 10*3/uL (ref 150–400)
RBC: 4.36 MIL/uL (ref 3.87–5.11)
RDW: 13 % (ref 11.5–15.5)
WBC: 4.7 10*3/uL (ref 4.0–10.5)

## 2016-12-19 LAB — COMPREHENSIVE METABOLIC PANEL
ALK PHOS: 46 U/L (ref 38–126)
ALT: 44 U/L (ref 14–54)
AST: 37 U/L (ref 15–41)
Albumin: 3.9 g/dL (ref 3.5–5.0)
Anion gap: 7 (ref 5–15)
BILIRUBIN TOTAL: 0.4 mg/dL (ref 0.3–1.2)
BUN: 13 mg/dL (ref 6–20)
CALCIUM: 9.3 mg/dL (ref 8.9–10.3)
CO2: 30 mmol/L (ref 22–32)
CREATININE: 0.72 mg/dL (ref 0.44–1.00)
Chloride: 103 mmol/L (ref 101–111)
GFR calc Af Amer: >60 mL/min (ref >60)
Glucose, Bld: 175 mg/dL — ABNORMAL HIGH (ref 65–99)
Potassium: 3.9 mmol/L (ref 3.5–5.1)
SODIUM: 140 mmol/L (ref 135–145)
TOTAL PROTEIN: 7.1 g/dL (ref 6.5–8.1)

## 2016-12-19 LAB — MAGNESIUM: MAGNESIUM: 2.1 mg/dL (ref 1.7–2.4)

## 2016-12-19 LAB — HEMOGLOBIN A1C
HEMOGLOBIN A1C: 7.9 % — AB (ref 4.8–5.6)
MEAN PLASMA GLUCOSE: 180 mg/dL

## 2016-12-19 LAB — PHOSPHORUS: Phosphorus: 3.5 mg/dL (ref 2.5–4.6)

## 2016-12-19 MED ORDER — TECHNETIUM TC 99M TETROFOSMIN IV KIT
10.0000 | PACK | Freq: Once | INTRAVENOUS | Status: AC | PRN
  Administered 2016-12-19: 10 via INTRAVENOUS

## 2016-12-19 MED ORDER — REGADENOSON 0.4 MG/5ML IV SOLN
0.4000 mg | Freq: Once | INTRAVENOUS | Status: AC
  Administered 2016-12-19: 0.4 mg via INTRAVENOUS

## 2016-12-19 MED ORDER — ALUM & MAG HYDROXIDE-SIMETH 200-200-20 MG/5ML PO SUSP: 30.0000 mL | Freq: Four times a day (QID) | ORAL | 0 refills | Status: AC | PRN

## 2016-12-19 MED ORDER — GI COCKTAIL ~~LOC~~
30.0000 mL | ORAL | Status: AC
  Administered 2016-12-19: 30 mL via ORAL
  Filled 2016-12-19: qty 30

## 2016-12-19 MED ORDER — TECHNETIUM TC 99M TETROFOSMIN IV KIT
30.0000 | PACK | Freq: Once | INTRAVENOUS | Status: AC | PRN
  Administered 2016-12-19: 30 via INTRAVENOUS

## 2016-12-19 MED ORDER — REGADENOSON 0.4 MG/5ML IV SOLN
INTRAVENOUS | Status: AC
  Administered 2016-12-19: 0.4 mg via INTRAVENOUS
  Filled 2016-12-19: qty 5

## 2016-12-19 MED ORDER — GI COCKTAIL ~~LOC~~
30.0000 mL | Freq: Once | ORAL | Status: AC
  Administered 2016-12-19: 30 mL via ORAL
  Filled 2016-12-19: qty 30

## 2016-12-19 NOTE — Progress Notes (Signed)
[facility] to pick up patient.  Patient stable at this time, no reports of CP.  [facility]

## 2016-12-19 NOTE — Progress Notes (Signed)
Progress Note  Patient Name: Sandra LenzSaundra Ealy Date of Encounter: 12/19/2016  Primary Cardiologist: New  Subjective   Patient seen in Echo, [facility]  Inpatient Medications    Scheduled Meds: . aspirin  81 mg Oral QHS  . enoxaparin (LOVENOX) injection  40 mg Subcutaneous Q24H  . gabapentin  300 mg Oral QHS  . lisinopril  20 mg Oral Daily   And  . hydrochlorothiazide  25 mg Oral Daily  . insulin aspart  0-5 Units Subcutaneous QHS  . insulin aspart  0-9 Units Subcutaneous TID WC  . insulin glargine  30 Units Subcutaneous QHS  . levothyroxine  50 mcg Oral QAC breakfast  . pantoprazole  40 mg Oral Daily  . pentoxifylline  800 mg Oral BID WC  . pneumococcal 23 valent vaccine  0.5 mL Intramuscular Tomorrow-1000  . pravastatin  20 mg Oral QHS   Continuous Infusions:  PRN Meds: acetaminophen, alum & mag hydroxide-simeth, ondansetron (ZOFRAN) IV   Vital Signs    Vitals:   12/18/16 0803 12/18/16 1304 12/18/16 2135 12/19/16 0445  BP: 109/73 108/66 (!) 102/57 93/66  Pulse: 66 72 72 80  Resp: 14 16 18 18   Temp: 98.2 F (36.8 C) 98.6 F (37 C) 97.8 F (36.6 C) 98.4 F (36.9 C)  TempSrc: Oral Oral Oral Oral  SpO2: 95% 96% 96% 98%  Weight:      Height:        Intake/Output Summary (Last 24 hours) at 12/19/16 1000 Last data filed at 12/19/16 0700  Gross per 24 hour  Intake              480 ml  Output                2 ml  Net              478 ml   Filed Weights   12/18/16 0519  Weight: 191 lb (86.6 kg)    Telemetry    Not on tele, evaluated during echo.  ECG    HR 69, sinus rhythm with occasional PVC's - Personally Reviewed  Physical Exam   GEN: No acute distress.   Neck: No JVD Cardiac: RRR,, no rubs, or gallops.  slight systolic murmur Respiratory: Clear to auscultation bilaterally.  GI: Soft, nontender, non-distended  MS: No edema; No deformity. Neuro:  Nonfocal  Psych: Normal affect   Labs    Chemistry Recent  Labs Lab 12/18/16 0530  NA 136  K 3.5  CL 99*  CO2 28  GLUCOSE 194*  BUN 16  CREATININE 0.82  CALCIUM 9.5  GFRNONAA >60  GFRAA >60  ANIONGAP 9    Hematology Recent Labs Lab 12/18/16 0530  WBC 7.7  RBC 4.33  HGB 12.8  HCT 39.0  MCV 90.1  MCH 29.6  MCHC 32.8  RDW 12.9  PLT 182   Cardiac Enzymes Recent Labs Lab 12/18/16 0802 12/18/16 1351 12/18/16 1939  TROPONINI <0.03 <0.03 <0.03    Recent Labs Lab 12/18/16 0539  TROPIPOC 0.00     Radiology    Dg Chest 2 View Result Date: 12/18/2016 Bronchitic changes.    Cardiac Studies   Transthoracic Echocardiography 12/18/2016 Study Conclusions  - Left ventricle: The cavity size was normal. Wall thickness was   normal. Systolic function was normal. The estimated ejection   fraction was in the range of 50% to 55%. Wall motion was normal;   there were no regional wall motion abnormalities.  Doppler   parameters are consistent with abnormal left ventricular   relaxation (grade 1 diastolic dysfunction). Doppler parameters   are consistent with high ventricular filling pressure. Impressions: - Normal LV systolic function; grade 1 diastolic dysfunction;   elevated LV filling pressure   Patient Profile     71 yo female with PMH of IDDM, GERD, hypothyroidism, HTN who presented with chest pain.    Assessment & Plan    1. Chest pain with Moderate Risk for Cardiac Etiology: No known cardiac disease, diabetes, hypertension and hyperlipidemia. Reports having a stress test in 2015 in [place] but does not remember the results entirely. --Symptoms are somewhat atypical in nature. Present both with rest and activity, worse after eating. Sometimes relieved with PPI, and tylenol. Sometimes described as musculoskeletal in nature. --Echo shows normal EF 50-55%, normal wall motion, normal systolic function, grade 1 diastolic dysfunction. --Results for [facility] are pending. If results are abnormal the plan will be  to consider a heart catheterization.  2. HTN: Controlled on admission  3. HL: on statin  4. IDDM: has been on lantus for about 5 years. Hgb A1c pending  5. GERD: on PPI  Dr. Herbie Baltimore has seen patient in lab post Lexiscan after abnormal EKG changes during exam. We are waiting for picture results to decide further need for treatment/evaluation.  Signed, Dorthula Matas, PA-C  12/19/2016, 10:00 AM     I have seen, examined and evaluated the patient this AM along with Ms. Neva Seat, PA-C.  After reviewing all the available data and chart, we discussed the patients laboratory, study & physical findings as well as symptoms in detail. I agree with her findings, examination as well as impression recommendations as per our discussion.   She just finished Lexiscan infusion - Inferior ST depression noted with infusion (will await results of Imaging portion - images trump EKG).  Still has intermitted Sx that sound like GERD, but also notes occasional L upper chest to arm discomfort. -- typical & atypical features.  BP controlled. On Insulin for DM.  Will await results of Imaging & make further recommendations.   Bryan Lemma, M.D., M.S. Interventional Cardiologist   Pager # [phone] Phone # [phone] [address] [place], [place] [address]

## 2016-12-19 NOTE — Progress Notes (Signed)
This RN received report from previous RN and agrees with all assessments. Will continue to monitor pt. closely

## 2016-12-19 NOTE — Discharge Summary (Addendum)
Physician Discharge Summary  Sandra Reed ZOX:[id number] DOB: 31-Oct-1945 DOA: 12/18/2016  PCP: No PCP Per Patient  Admit date: 12/18/2016 Discharge date: 12/19/2016  Admitted From: Home Disposition:  Home  Recommendations for Outpatient Follow-up:  1. Follow up with PCP in 1-2 weeks in [place] 2. Follow up with Regular Cardiologist in 1-2 weeks 3. Follow up with ENT as an outpatient to evaluate ear fullness 4. Please obtain BMP/CBC in one week  Home Health: No Equipment/Devices: None  Discharge Condition: Stable CODE STATUS: FULL CODE Diet recommendation: Heart Healthy / Carb Modified    Brief/Interim Summary: Sandra Reed is a 71 y.o. female with medical history significant of type 2 diabetes mellitus on insulin, GERD, hypothyroidism, hypertension, who presented to the emergency room with a chief complaint of chest pain.  Patient is somewhat of a poor historian, however told the admitting physician that over the last 3 months she has been having intermittent midsternal chest pain radiating under her left wrist. Sometimes this pain is associated with food and sometimes it is not.  She has not noticed a consistent association with exercise as well, "sometimes" it is and sometimes is not.  She also has been having intermittent nausea along with chest pain.  He denies any abdominal pain, no diarrhea or constipation.  She has no palpitations.  She describes the pain as tightness.  She has brought this to the attention of her PCP, and he has plan a 2D echo as well as a stress test next week.  When she woke up this morning, her pain was so severe and she also felt short of breath, she panicked and decided to come to the emergency room.  She denies any cough or chest congestion or URI symptoms.  Prior to the 3 months she has been having this intermittent chest pains, she never had this problem.  This is her second ED visit for the same problem. She was admitted for a Chest Pain rule out and  Cardiology was consulted for further evaluation and recommendations. Because of patient's typical and atypical symptoms along with risk factors an ECHOCardiogram was done as well as a Myoview Stress Test. Myoview Stress Test is still pending official read but per coversation with Dr. Herbie Baltimore, he discussed with his colleague and it looks normal with Normal EF and no ischemia. It was suspected because of this that her symptoms were more attributable to a GI Cause rather than a cardiac cause. Patient was deemed medically stable to be D/C'd Home and will need to follow up with her PCP within 1 week and with her Regular Cardiologist as an outpatient.   Discharge Diagnoses:  Active Problems:   Chest pain   Diabetes mellitus (HCC)   HTN (hypertension)   GERD (gastroesophageal reflux disease)   Hypothyroidism  Chest pain r/o'd ACS -Cardiac Troponin I x 3 Negative; Given GI Cocktail this AM with improvement  -Likely Gastric Reflux reflux;  however she is diabetic, 71 years old, and may have an anginal equivalent which is atypical -Cardiology Consulted and patient underwent ECHO and Myoview Stress Test -ECHO showed Normal LV systolic function; grade 1 diastolic dysfunction;   elevated LV filling pressure. -Myoview Stress Test was discussed with Cardiology Dr. Herbie Baltimore and he stated that his colleague read and reported Myoview looked to be normal with no ischemia and normal EF; Official Report still not in EPIC yet -Patient to follow up with PCP and her Primary Cardiologist as an outpatient   Insulin Dependent Diabetes Mellitus -Patient  states that most recent hemoglobin A1c was in the mid sevens, recheck in our hospital showed HbA1c was 7.9 -Resume Home Diabetic Medications and Regimen -C/w Gabapentin for Neuropathy  Hypertension -Resume home medications of Lisinopril, HCTZ, and Metoprolol  GERD -Resume PPI; Added Maloxx/Mylanta at D/C -Will need to follow up with PCP and possible  Gastroenterologist at D/C as an outpatient  Hyperlipidemia -Continue Pravachol  Hypothyroidism -Continue Home Levothyroxine  Arterial Vascular Insuffiencey -Continue Home Pentoxifylline   Grade 1 Diastolic Dysfunction -Seen on ECHO -Currently Compensated -C/w Lisinopril and Meotprolol -Maintain Hypertesive Control -Follow up with Regular Cardiologist for further evaluation and discussion -Low Sodium Diet  Discharge Instructions  Discharge Instructions    Call MD for:  difficulty breathing, headache or visual disturbances    Complete by:  As directed    Call MD for:  extreme fatigue    Complete by:  As directed    Call MD for:  persistant dizziness or light-headedness    Complete by:  As directed    Call MD for:  persistant nausea and vomiting    Complete by:  As directed    Call MD for:  severe uncontrolled pain    Complete by:  As directed    Call MD for:  temperature >100.4    Complete by:  As directed    Diet - low sodium heart healthy    Complete by:  As directed    Discharge instructions    Complete by:  As directed    Follow up with PCP and with your Regular Cardiologist as an outpatient. Take all medications as prescribed. If symptoms change or worsen please return to the ED for evaluation.   Increase activity slowly    Complete by:  As directed      Allergies as of 12/19/2016   No Known Allergies     Medication List    STOP taking these medications   meloxicam 15 MG tablet Commonly known as:  MOBIC     TAKE these medications   acetaminophen 650 MG CR tablet Commonly known as:  TYLENOL Take 650 mg by mouth every 8 (eight) hours as needed for pain.   alum & mag hydroxide-simeth 200-200-20 MG/5ML suspension Commonly known as:  MAALOX/MYLANTA Take 30 mLs by mouth every 6 (six) hours as needed for indigestion.   aspirin 81 MG chewable tablet Chew 81 mg by mouth at bedtime.   gabapentin 300 MG capsule Commonly known as:  NEURONTIN Take 300 mg by  mouth at bedtime.   insulin glargine 100 unit/mL Sopn Commonly known as:  LANTUS Inject 45 Units into the skin at bedtime.   levothyroxine 50 MCG tablet Commonly known as:  SYNTHROID, LEVOTHROID Take 50 mcg by mouth daily before breakfast.   lisinopril-hydrochlorothiazide 20-25 MG tablet Commonly known as:  PRINZIDE,ZESTORETIC Take 1 tablet by mouth daily.   metFORMIN 500 MG 24 hr tablet Commonly known as:  GLUCOPHAGE-XR Take 1,000 mg by mouth 2 (two) times daily.   metoprolol 50 MG tablet Commonly known as:  LOPRESSOR Take 50 mg by mouth daily.   montelukast 10 MG tablet Commonly known as:  SINGULAIR Take 1 tablet (10 mg total) by mouth at bedtime. What changed:  when to take this  reasons to take this   omeprazole 40 MG capsule Commonly known as:  PRILOSEC Take 40 mg by mouth daily.   pentoxifylline 400 MG CR tablet Commonly known as:  TRENTAL Take 800 mg by mouth 2 (two) times daily with  a meal.   pravastatin 20 MG tablet Commonly known as:  PRAVACHOL Take 20 mg by mouth at bedtime.   repaglinide 2 MG tablet Commonly known as:  PRANDIN Take 4 mg by mouth 3 (three) times daily before meals.       No Known Allergies  Consultations:  Cardiology Dr. Bryan Lemma  Procedures/Studies: Dg Chest 2 View  Result Date: 12/18/2016 CLINICAL DATA:  Left-sided chest pain. EXAM: CHEST  2 VIEW COMPARISON:  11/19/2016 FINDINGS: The cardiomediastinal contours are normal. Mild central bronchitic change. Pulmonary vasculature is normal. No consolidation, pleural effusion, or pneumothorax. No acute osseous abnormalities are seen. IMPRESSION: Bronchitic changes. Electronically Signed   By: Rubye Oaks M.D.   On: 12/18/2016 05:51    ECHOCARDIOGRAM Study Conclusions  - Left ventricle: The cavity size was normal. Wall thickness was   normal. Systolic function was normal. The estimated ejection   fraction was in the range of 50% to 55%. Wall motion was normal;    there were no regional wall motion abnormalities. Doppler   parameters are consistent with abnormal left ventricular   relaxation (grade 1 diastolic dysfunction). Doppler parameters   are consistent with high ventricular filling pressure.  Impressions:  - Normal LV systolic function; grade 1 diastolic dysfunction;   elevated LV filling pressure.  MYOVIEW STRESS TEST -Per Verbal Report from Dr. Elissa Hefty conversation with [place] MD: Looks to be Normal, No Ischemia, Normal EF both Nuclear and ECHO; Twave Inversions persisted and no Segment deviation noted during stress.   Subjective: Seen and examined at bedside and had no more chest pain. Complained of ear fullness. No nausea or Vomiting and stated CP was intermittent. Discussed with Dr. Herbie Baltimore who stated that he spoke with the MD that was reading the Myoview Stress Test report and stated that it looked to be normal with no ischemia and normal EF on both Myoview and ECHO. Patient ready to go home.  Discharge Exam: Vitals:   12/18/16 2135 12/19/16 0445  BP: (!) 102/57 93/66  Pulse: 72 80  Resp: 18 18  Temp: 97.8 F (36.6 C) 98.4 F (36.9 C)   Vitals:   12/18/16 0803 12/18/16 1304 12/18/16 2135 12/19/16 0445  BP: 109/73 108/66 (!) 102/57 93/66  Pulse: 66 72 72 80  Resp: 14 16 18 18   Temp: 98.2 F (36.8 C) 98.6 F (37 C) 97.8 F (36.6 C) 98.4 F (36.9 C)  TempSrc: Oral Oral Oral Oral  SpO2: 95% 96% 96% 98%  Weight:      Height:       General: Pt is alert, awake, not in acute distress Cardiovascular: RRR, S1/S2 +, no rubs, no gallops Respiratory: CTA bilaterally, no wheezing, no rhonchi Abdominal: Soft, NT, ND, bowel sounds + Extremities: no edema, no cyanosis  The results of significant diagnostics from this hospitalization (including imaging, microbiology, ancillary and laboratory) are listed below for reference.    Microbiology: No results found for this or any previous visit (from the past 240 hour(s)).    Labs: BNP (last 3 results) No results for input(s): BNP in the last 8760 hours. Basic Metabolic Panel:  Recent Labs Lab 12/18/16 0530 12/19/16 1202  NA 136 140  K 3.5 3.9  CL 99* 103  CO2 28 30  GLUCOSE 194* 175*  BUN 16 13  CREATININE 0.82 0.72  CALCIUM 9.5 9.3  MG  --  2.1  PHOS  --  3.5   Liver Function Tests:  Recent Labs Lab 12/19/16 1202  AST  37  ALT 44  ALKPHOS 46  BILITOT 0.4  PROT 7.1  ALBUMIN 3.9   No results for input(s): LIPASE, AMYLASE in the last 168 hours. No results for input(s): AMMONIA in the last 168 hours. CBC:  Recent Labs Lab 12/18/16 0530 12/19/16 1202  WBC 7.7 4.7  NEUTROABS  --  2.8  HGB 12.8 12.9  HCT 39.0 39.8  MCV 90.1 91.3  PLT 182 160   Cardiac Enzymes:  Recent Labs Lab 12/18/16 0802 12/18/16 1351 12/18/16 1939  TROPONINI <0.03 <0.03 <0.03   BNP: Invalid input(s): POCBNP CBG:  Recent Labs Lab 12/18/16 1140 12/18/16 1646 12/18/16 2242 12/19/16 0732 12/19/16 1227  GLUCAP 108* 156* 149* 140* 187*   D-Dimer No results for input(s): DDIMER in the last 72 hours. Hgb A1c  Recent Labs  12/18/16 0802  HGBA1C 7.9*   Lipid Profile No results for input(s): CHOL, HDL, LDLCALC, TRIG, CHOLHDL, LDLDIRECT in the last 72 hours. Thyroid function studies No results for input(s): TSH, T4TOTAL, T3FREE, THYROIDAB in the last 72 hours.  Invalid input(s): FREET3 Anemia work up No results for input(s): VITAMINB12, FOLATE, FERRITIN, TIBC, IRON, RETICCTPCT in the last 72 hours. Urinalysis No results found for: COLORURINE, APPEARANCEUR, LABSPEC, PHURINE, GLUCOSEU, HGBUR, BILIRUBINUR, KETONESUR, PROTEINUR, UROBILINOGEN, NITRITE, LEUKOCYTESUR Sepsis Labs Invalid input(s): PROCALCITONIN,  WBC,  LACTICIDVEN Microbiology No results found for this or any previous visit (from the past 240 hour(s)).  Time coordinating discharge: 40 minutes  SIGNED:  Merlene Laughtermair Latif Sheikh, DO [facility] 12/19/2016, 2:47 PM Pager  [phone]  If 7PM-7AM, please contact night-coverage [email or web address] Password [email or web address]

## 2017-01-18 ENCOUNTER — Emergency Department (HOSPITAL_COMMUNITY)
Admission: EM | Admit: 2017-01-18 | Discharge: 2017-01-18 | Disposition: A | Discharge status: Home / Self Care | Payer: Medicare Other | Attending: Emergency Medicine | Admitting: Emergency Medicine

## 2017-01-18 ENCOUNTER — Emergency Department (HOSPITAL_COMMUNITY): Payer: Medicare Other

## 2017-01-18 ENCOUNTER — Encounter (HOSPITAL_COMMUNITY): Payer: Self-pay | Admitting: Emergency Medicine

## 2017-01-18 DIAGNOSIS — E119 Type 2 diabetes mellitus without complications: Secondary | ICD-10-CM | POA: Diagnosis not present

## 2017-01-18 DIAGNOSIS — I10 Essential (primary) hypertension: Secondary | ICD-10-CM | POA: Insufficient documentation

## 2017-01-18 DIAGNOSIS — Z7984 Long term (current) use of oral hypoglycemic drugs: Secondary | ICD-10-CM | POA: Insufficient documentation

## 2017-01-18 DIAGNOSIS — R079 Chest pain, unspecified: Secondary | ICD-10-CM

## 2017-01-18 DIAGNOSIS — E039 Hypothyroidism, unspecified: Secondary | ICD-10-CM | POA: Insufficient documentation

## 2017-01-18 DIAGNOSIS — Z7982 Long term (current) use of aspirin: Secondary | ICD-10-CM | POA: Diagnosis not present

## 2017-01-18 DIAGNOSIS — R0789 Other chest pain: Secondary | ICD-10-CM | POA: Insufficient documentation

## 2017-01-18 LAB — BASIC METABOLIC PANEL
Anion gap: 14 (ref 5–15)
BUN: 14 mg/dL (ref 6–20)
CALCIUM: 9.5 mg/dL (ref 8.9–10.3)
CHLORIDE: 97 mmol/L — AB (ref 101–111)
CO2: 27 mmol/L (ref 22–32)
CREATININE: 0.8 mg/dL (ref 0.44–1.00)
Glucose, Bld: 130 mg/dL — ABNORMAL HIGH (ref 65–99)
Potassium: 4.3 mmol/L (ref 3.5–5.1)
SODIUM: 138 mmol/L (ref 135–145)

## 2017-01-18 LAB — CBC
HCT: 40.1 % (ref 36.0–46.0)
Hemoglobin: 12.8 g/dL (ref 12.0–15.0)
MCH: 29.7 pg (ref 26.0–34.0)
MCHC: 31.9 g/dL (ref 30.0–36.0)
MCV: 93 fL (ref 78.0–100.0)
PLATELETS: 173 10*3/uL (ref 150–400)
RBC: 4.31 MIL/uL (ref 3.87–5.11)
RDW: 13.2 % (ref 11.5–15.5)
WBC: 6.2 10*3/uL (ref 4.0–10.5)

## 2017-01-18 LAB — I-STAT TROPONIN, ED: TROPONIN I, POC: 0 ng/mL (ref 0.00–0.08)

## 2017-01-18 MED ORDER — GI COCKTAIL ~~LOC~~
30.0000 mL | Freq: Once | ORAL | Status: AC
  Administered 2017-01-18: 30 mL via ORAL
  Filled 2017-01-18: qty 30

## 2017-01-18 MED ORDER — SUCRALFATE 1 G PO TABS: 1.0000 g | ORAL_TABLET | Freq: Three times a day (TID) | ORAL | 0 refills | Status: AC

## 2017-01-18 NOTE — Discharge Instructions (Signed)
We advised the use of Carafate with your current daily medications. Avoid foods that may aggravate your reflux. Given your history of nasal congestion, we recommend use of a Nettie pot to help with your sensation of ear fullness. Continue follow-up with your gastroenterologist on Monday for your endoscopy. We also recommend follow-up with your primary care doctor for further evaluation of your chest pain, if symptoms persist.

## 2017-01-18 NOTE — ED Provider Notes (Signed)
  Face-to-face evaluation   History: Patient presents for evaluation of ongoing left neck shoulder and arm pain.  She also complains of a tightness in her upper mid abdomen and lower chest.  She denies nausea vomiting weakness or dizziness.  She is due to have an endoscopy done in 4 days.  Physical exam: Alert elderly female who is comfortable.  Heart regular rate and rhythm no murmur lungs clear anteriorly.  Abdomen soft nontender.  Medical screening examination/treatment/procedure(s) were conducted as a shared visit with non-physician practitioner(s) and myself.  I personally evaluated the patient during the encounter   Mancel Bale, MD 01/19/17 1640

## 2017-01-18 NOTE — ED Triage Notes (Signed)
Pt reports left chest pain that radiates to left arm, jaw and neck, reports pain has been recurrent off and on x1 month, had stress test that was clear and is schedule to have endoscopy on Monday. Pt a/ox4, resp e/u, nad.

## 2017-01-18 NOTE — ED Provider Notes (Signed)
[facility]-EMERGENCY DEPT Provider Note   CSN: [id number] Arrival date & time: 01/18/17  1650     History   Chief Complaint Chief Complaint  Patient presents with  . Chest Pain    HPI Sandra Reed is a 71 y.o. female.   71 year old female with a history of diabetes mellitus, esophageal reflux, and thyroid disease presents to the emergency department for evaluation of chest pain. She reports intermittent chest pain over the past few months. She notes that her pain is similar to pain she had on prior admission one month ago. She describes the pain as burning. It waxes and wanes in severity. Pain is associated with shortness of breath, at times. Patient also notes some nonspecific paresthesias in her left hand radiating towards her neck. She has had no recent fevers, associated diaphoresis, nausea, vomiting, extremity weakness, syncope or near-syncope, or leg swelling. She has noticed some improvement in her pain with belching or passing of flatus in the past. She has been told that her symptoms may be secondary to worsening reflux. She is due for an endoscopy in 3 days. No prior history of heart attack. No known coronary artery disease, hypertension, or dyslipidemia.  12/18/16 - LVEF 50-55% with mild, grade 1 diastolic dysfunction.      Past Medical History:  Diagnosis Date  . Diabetes mellitus without complication (HCC)   . GERD (gastroesophageal reflux disease)   . Thyroid disease     Patient Active Problem List   Diagnosis Date Noted  . Chest pain 12/18/2016  . Diabetes mellitus (HCC) 12/18/2016  . HTN (hypertension) 12/18/2016  . GERD (gastroesophageal reflux disease) 12/18/2016  . Hypothyroidism 12/18/2016    Past Surgical History:  Procedure Laterality Date  . ABDOMINAL HYSTERECTOMY      OB History    No data available       Home Medications    Prior to Admission medications   Medication Sig Start Date End Date Taking? Authorizing Provider  acetaminophen  (TYLENOL) 650 MG CR tablet Take 650 mg by mouth every 8 (eight) hours as needed for pain.    Historical Provider, MD  alum & mag hydroxide-simeth (MAALOX/MYLANTA) 200-200-20 MG/5ML suspension Take 30 mLs by mouth every 6 (six) hours as needed for indigestion. 12/19/16   Merlene Laughter, DO  aspirin 81 MG chewable tablet Chew 81 mg by mouth at bedtime.     Historical Provider, MD  gabapentin (NEURONTIN) 300 MG capsule Take 300 mg by mouth at bedtime.     Historical Provider, MD  insulin glargine (LANTUS) 100 unit/mL SOPN Inject 45 Units into the skin at bedtime.    Historical Provider, MD  levothyroxine (SYNTHROID, LEVOTHROID) 50 MCG tablet Take 50 mcg by mouth daily before breakfast.    Historical Provider, MD  lisinopril-hydrochlorothiazide (PRINZIDE,ZESTORETIC) 20-25 MG tablet Take 1 tablet by mouth daily.     Historical Provider, MD  metFORMIN (GLUCOPHAGE-XR) 500 MG 24 hr tablet Take 1,000 mg by mouth 2 (two) times daily.  08/27/16   Historical Provider, MD  metoprolol (LOPRESSOR) 50 MG tablet Take 50 mg by mouth daily.     Historical Provider, MD  montelukast (SINGULAIR) 10 MG tablet Take 1 tablet (10 mg total) by mouth at bedtime. Patient taking differently: Take 10 mg by mouth at bedtime as needed (for allergies).  07/27/16   Sherren Mocha, MD  omeprazole (PRILOSEC) 40 MG capsule Take 40 mg by mouth daily.    Historical Provider, MD  pentoxifylline (TRENTAL) 400 MG CR tablet  Take 800 mg by mouth 2 (two) times daily with a meal.     Historical Provider, MD  pravastatin (PRAVACHOL) 20 MG tablet Take 20 mg by mouth at bedtime.     Historical Provider, MD  repaglinide (PRANDIN) 2 MG tablet Take 4 mg by mouth 3 (three) times daily before meals.     Historical Provider, MD  sucralfate (CARAFATE) 1 g tablet Take 1 tablet (1 g total) by mouth 3 (three) times daily with meals. 01/18/17   Antony Madura, PA-C    Family History Family History  Problem Relation Age of Onset  . Diabetes Mother   .  Hyperlipidemia Mother   . Hypertension Mother   . Cancer Father     Social History Social History  Substance Use Topics  . Smoking status: Never Smoker  . Smokeless tobacco: Never Used  . Alcohol use No     Allergies   Patient has no known allergies.   Review of Systems Review of Systems Ten systems reviewed and are negative for acute change, except as noted in the HPI.    Physical Exam Updated Vital Signs BP 110/74   Pulse 66   Temp 98.1 F (36.7 C) (Oral)   Resp (!) 9   SpO2 95%   Physical Exam  Constitutional: She is oriented to person, place, and time. She appears well-developed and well-nourished. No distress.  Nontoxic appearing and in no acute distress  HENT:  Head: Normocephalic and atraumatic.  Bilateral TMs normal.  Eyes: Conjunctivae and EOM are normal. No scleral icterus.  Neck: Normal range of motion.  No JVD. No meningismus.  Cardiovascular: Normal rate, regular rhythm and intact distal pulses.   Pulmonary/Chest: Effort normal. No respiratory distress. She has no wheezes. She has no rales.  Respirations even and unlabored. Lungs clear to auscultation bilaterally.  Musculoskeletal: Normal range of motion.  No lower extremity pitting edema  Neurological: She is alert and oriented to person, place, and time. She exhibits normal muscle tone. Coordination normal.  Equal grip strength bilaterally as well as strength against resistance in all major muscle groups bilaterally. Moving all extremities.  Skin: Skin is warm and dry. No rash noted. She is not diaphoretic. No erythema. No pallor.  Psychiatric: She has a normal mood and affect. Her behavior is normal.  Nursing note and vitals reviewed.    ED Treatments / Results  Labs (all labs ordered are listed, but only abnormal results are displayed) Labs Reviewed  BASIC METABOLIC PANEL - Abnormal; Notable for the following:       Result Value   Chloride 97 (*)    Glucose, Bld 130 (*)    All other  components within normal limits  CBC  I-STAT TROPOININ, ED    ED ECG REPORT   Date: 01/18/2017  Rate: 75  Rhythm: normal sinus rhythm  QRS Axis: normal  Intervals: normal  ST/T Wave abnormalities: normal and nonspecific T wave changes in lead III  Conduction Disutrbances:none  Narrative Interpretation:   Old EKG Reviewed: none available  I have personally reviewed the EKG tracing and agree with the computerized printout as noted.   Radiology Dg Chest 2 View  Result Date: 01/18/2017 CLINICAL DATA:  Chest tightness for the last 3 months.  Chest pain. EXAM: CHEST  2 VIEW COMPARISON:  12/18/2016 FINDINGS: Heart size is normal. Mediastinal shadows are normal. Lungs are clear. The vascularity is normal. Ordinary degenerative changes affect the spine. IMPRESSION: No active disease Electronically Signed   By:  Paulina Fusi M.D.   On: 01/18/2017 17:24    Procedures Procedures (including critical care time)  Medications Ordered in ED Medications  gi cocktail (Maalox,Lidocaine,Donnatal) (30 mLs Oral Given 01/18/17 2151)     Initial Impression / Assessment and Plan / ED Course  I have reviewed the triage vital signs and the nursing notes.  Pertinent labs & imaging results that were available during my care of the patient were reviewed by me and considered in my medical decision making (see chart for details).     71 year old female presents to the emergency department for evaluation of chest pain. She has had similar chest pain intermittently over the past few months. Pain will slightly improve with belching or passing of flatus. She reports shortness of breath associated with her pain at times.  The patient was admitted for this chest pain 1 month ago. She was evaluated by cardiology and had a reassuring echocardiogram. No risk factors for coronary artery disease. Low suspicion for ACS today, especially given symptom chronicity, reassuring EKG, and negative troponin. Chest x-ray  without evidence of acute cardiopulmonary abnormality. No evidence of mediastinal widening to suggest dissection. No pneumonia, pneumothorax, or pleural effusion.  Symptoms were treated in the emergency department with a GI cocktail. The patient has been encouraged to keep her scheduled endoscopy in 3 days. I have a high suspicion that intermittent pain may be secondary to reflux and gastritis. Primary care follow-up advised if symptoms persist. Return precautions provided. Patient discharged in stable condition with no unaddressed concerns. Patient seen and examined, also, by my attending who is in agreement with this workup, assessment, management plan, and patient's stability for discharge.   Final Clinical Impressions(s) / ED Diagnoses   Final diagnoses:  Nonspecific chest pain    New Prescriptions New Prescriptions   SUCRALFATE (CARAFATE) 1 G TABLET    Take 1 tablet (1 g total) by mouth 3 (three) times daily with meals.     Antony Madura, PA-C 01/19/17 0030    Mancel Bale, MD 01/19/17 1639    Mancel Bale, MD 01/19/17 1640

## 2017-01-18 NOTE — ED Notes (Signed)
PT states understanding of care given, follow up care, and medication prescribed. PT wheelchaired from ED to car with a steady gait. 

## 2017-01-18 NOTE — ED Notes (Signed)
Pt states that when she was taking her gerd medication twice a day, which prevented any pain from occuring

## 2017-01-24 ENCOUNTER — Emergency Department (HOSPITAL_COMMUNITY): Payer: Medicare Other

## 2017-01-24 ENCOUNTER — Emergency Department (HOSPITAL_COMMUNITY)
Admission: EM | Admit: 2017-01-24 | Discharge: 2017-01-24 | Disposition: A | Discharge status: Home / Self Care | Payer: Medicare Other | Attending: Emergency Medicine | Admitting: Emergency Medicine

## 2017-01-24 ENCOUNTER — Encounter (HOSPITAL_COMMUNITY): Payer: Self-pay | Admitting: Emergency Medicine

## 2017-01-24 DIAGNOSIS — M542 Cervicalgia: Secondary | ICD-10-CM | POA: Diagnosis present

## 2017-01-24 DIAGNOSIS — I10 Essential (primary) hypertension: Secondary | ICD-10-CM | POA: Insufficient documentation

## 2017-01-24 DIAGNOSIS — Z794 Long term (current) use of insulin: Secondary | ICD-10-CM | POA: Diagnosis not present

## 2017-01-24 DIAGNOSIS — E119 Type 2 diabetes mellitus without complications: Secondary | ICD-10-CM | POA: Diagnosis not present

## 2017-01-24 DIAGNOSIS — R1013 Epigastric pain: Secondary | ICD-10-CM | POA: Insufficient documentation

## 2017-01-24 DIAGNOSIS — E039 Hypothyroidism, unspecified: Secondary | ICD-10-CM | POA: Diagnosis not present

## 2017-01-24 LAB — BASIC METABOLIC PANEL
Anion gap: 9 (ref 5–15)
BUN: 15 mg/dL (ref 6–20)
CALCIUM: 9.2 mg/dL (ref 8.9–10.3)
CO2: 28 mmol/L (ref 22–32)
Chloride: 103 mmol/L (ref 101–111)
Creatinine, Ser: 0.79 mg/dL (ref 0.44–1.00)
GFR calc Af Amer: >60 mL/min (ref >60)
GLUCOSE: 71 mg/dL (ref 65–99)
Potassium: 3.7 mmol/L (ref 3.5–5.1)
SODIUM: 140 mmol/L (ref 135–145)

## 2017-01-24 LAB — CBC
HEMATOCRIT: 38 % (ref 36.0–46.0)
HEMOGLOBIN: 12.4 g/dL (ref 12.0–15.0)
MCH: 30 pg (ref 26.0–34.0)
MCHC: 32.6 g/dL (ref 30.0–36.0)
MCV: 91.8 fL (ref 78.0–100.0)
Platelets: 164 10*3/uL (ref 150–400)
RBC: 4.14 MIL/uL (ref 3.87–5.11)
RDW: 13 % (ref 11.5–15.5)
WBC: 5.5 10*3/uL (ref 4.0–10.5)

## 2017-01-24 LAB — I-STAT TROPONIN, ED: TROPONIN I, POC: 0 ng/mL (ref 0.00–0.08)

## 2017-01-24 NOTE — ED Triage Notes (Signed)
Patient is complaining of chest pain/flutter, arm/neck/jaw pain for about 1 month. Patient seen recently for the same. Patient had endoscopy on Monday to rule out GI issues.

## 2017-01-24 NOTE — Discharge Instructions (Signed)
For the neck pain, take Tylenol every 4 hours.  Continue your prescribed medications.  Follow-up with your gastroenterologist, and scheduled, next week for the endoscopy.  Follow up with your cardiologist for further testing, to be evaluated for occult cardiac disease.

## 2017-01-24 NOTE — ED Provider Notes (Signed)
[facility]-EMERGENCY DEPT Provider Note   CSN: [id number] Arrival date & time: 01/24/17  1745     History   Chief Complaint Chief Complaint  Patient presents with  . Chest Pain  . Arm Pain    HPI Sandra Reed is a 71 y.o. female.  Patient presents for evaluation of ongoing left-sided neck pain radiating to chest, left arm and left leg.  Seen in the ED recently, for same.  She has subsequently had a upper endoscopy done about 10 days ago which showed "acid" problem.  She is taking a PPI.  She is due to have a colonoscopy done later this week.  She is attempted to call her cardiology to schedule a follow-up appointment.  In March 2018 she was hospitalized and had cardiac echo and nuclear medicine cardiac stress testing.  She was told that she had low risk for cardiac disease, but there was a possibility of "false negative.".  She is not getting better with her usual medications, so came here for reevaluation.  The patient denies fever, chills, cough, shortness of breath, focal weakness, or dizziness.  There are no other known modifying factors.    HPI  Past Medical History:  Diagnosis Date  . Diabetes mellitus without complication (HCC)   . GERD (gastroesophageal reflux disease)   . Thyroid disease     Patient Active Problem List   Diagnosis Date Noted  . Chest pain 12/18/2016  . Diabetes mellitus (HCC) 12/18/2016  . HTN (hypertension) 12/18/2016  . GERD (gastroesophageal reflux disease) 12/18/2016  . Hypothyroidism 12/18/2016    Past Surgical History:  Procedure Laterality Date  . ABDOMINAL HYSTERECTOMY      OB History    No data available       Home Medications    Prior to Admission medications   Medication Sig Start Date End Date Taking? Authorizing Provider  acetaminophen (TYLENOL) 650 MG CR tablet Take 650 mg by mouth every 8 (eight) hours as needed for pain.   Yes Historical Provider, MD  alum & mag hydroxide-simeth (MAALOX/MYLANTA) 200-200-20 MG/5ML suspension  Take 30 mLs by mouth every 6 (six) hours as needed for indigestion. 12/19/16  Yes Omair Latif Sheikh, DO  aspirin 81 MG chewable tablet Chew 81 mg by mouth at bedtime.    Yes Historical Provider, MD  gabapentin (NEURONTIN) 300 MG capsule Take 300 mg by mouth at bedtime.    Yes Historical Provider, MD  insulin glargine (LANTUS) 100 unit/mL SOPN Inject 45 Units into the skin at bedtime.   Yes Historical Provider, MD  levothyroxine (SYNTHROID, LEVOTHROID) 50 MCG tablet Take 50 mcg by mouth daily before breakfast.   Yes Historical Provider, MD  lisinopril-hydrochlorothiazide (PRINZIDE,ZESTORETIC) 20-25 MG tablet Take 1 tablet by mouth daily.    Yes Historical Provider, MD  metFORMIN (GLUCOPHAGE-XR) 500 MG 24 hr tablet Take 1,000 mg by mouth 2 (two) times daily.  08/27/16  Yes Historical Provider, MD  metoprolol (LOPRESSOR) 50 MG tablet Take 50 mg by mouth daily.    Yes Historical Provider, MD  montelukast (SINGULAIR) 10 MG tablet Take 1 tablet (10 mg total) by mouth at bedtime. Patient taking differently: Take 10 mg by mouth at bedtime as needed (for allergies).  07/27/16  Yes Sherren Mocha, MD  omeprazole (PRILOSEC) 40 MG capsule Take 40 mg by mouth daily.   Yes Historical Provider, MD  pentoxifylline (TRENTAL) 400 MG CR tablet Take 800 mg by mouth 2 (two) times daily with a meal.    Yes Historical  Provider, MD  pravastatin (PRAVACHOL) 20 MG tablet Take 20 mg by mouth at bedtime.    Yes Historical Provider, MD  repaglinide (PRANDIN) 2 MG tablet Take 4 mg by mouth 3 (three) times daily before meals.    Yes Historical Provider, MD  sucralfate (CARAFATE) 1 g tablet Take 1 tablet (1 g total) by mouth 3 (three) times daily with meals. 01/18/17  Yes Antony MaduraKelly Humes, PA-C    Family History Family History  Problem Relation Age of Onset  . Diabetes Mother   . Hyperlipidemia Mother   . Hypertension Mother   . Cancer Father     Social History Social History  Substance Use Topics  . Smoking status: Never Smoker    . Smokeless tobacco: Never Used  . Alcohol use No     Allergies   Patient has no known allergies.   Review of Systems Review of Systems  All other systems reviewed and are negative.    Physical Exam Updated Vital Signs BP 121/79 (BP Location: Right Arm)   Pulse 71   Temp 97.9 F (36.6 C) (Oral)   Resp 18   Ht 5\' 6"  (1.676 m)   Wt 190 lb (86.2 kg)   SpO2 100%   BMI 30.67 kg/m   Physical Exam  Constitutional: She is oriented to person, place, and time. She appears well-developed.  Elderly, overweight  HENT:  Head: Normocephalic and atraumatic.  Eyes: Conjunctivae and EOM are normal. Pupils are equal, round, and reactive to light.  Neck: Normal range of motion and phonation normal. Neck supple.  Cardiovascular: Normal rate and regular rhythm.   Pulmonary/Chest: Effort normal and breath sounds normal. No respiratory distress. She has no wheezes. She exhibits no tenderness.  Musculoskeletal: Normal range of motion.  Neurological: She is alert and oriented to person, place, and time. She exhibits normal muscle tone.  Skin: Skin is warm and dry.  Psychiatric: She has a normal mood and affect. Her behavior is normal. Judgment and thought content normal.  Nursing note and vitals reviewed.    ED Treatments / Results  Labs (all labs ordered are listed, but only abnormal results are displayed) Labs Reviewed  BASIC METABOLIC PANEL  CBC  I-STAT TROPOININ, ED    EKG  EKG Interpretation  Date/Time:  Thursday Jan 24 2017 18:09:41 EDT Ventricular Rate:  70 PR Interval:    QRS Duration: 84 QT Interval:  378 QTC Calculation: 408 R Axis:   62 Text Interpretation:  Sinus rhythm since last tracing no significant change Confirmed by Mancel BaleWentz, Tee Richeson [phone]([id number]) on 01/24/2017 7:11:45 PM       Radiology Dg Chest 2 View  Result Date: 01/24/2017 CLINICAL DATA:  71 y/o  F; chest pain. EXAM: CHEST  2 VIEW COMPARISON:  01/18/2017 chest radiograph FINDINGS: Stable heart size and  mediastinal contours are within normal limits. Both lungs are clear. Multilevel degenerative changes of the thoracic spine. IMPRESSION: No acute pulmonary process identified. Electronically Signed   By: Mitzi HansenLance  Furusawa-Stratton M.D.   On: 01/24/2017 18:51   Ct Cervical Spine Wo Contrast  Result Date: 01/24/2017 CLINICAL DATA:  Cervical radiculopathy on the left. EXAM: CT CERVICAL SPINE WITHOUT CONTRAST TECHNIQUE: Multidetector CT imaging of the cervical spine was performed without intravenous contrast. Multiplanar CT image reconstructions were also generated. COMPARISON:  None. FINDINGS: Alignment: Normal Skull base and vertebrae: Negative for fracture or mass. Soft tissues and spinal canal: Negative Disc levels: Mild cervical spine degenerative change. Mild uncinate spurring at C4-5 without significant stenosis.  Upper chest: Lung apices clear Other: None IMPRESSION: Mild cervical spondylosis at C4-5. No significant stenosis of the canal or foramen. Electronically Signed   By: Marlan Palau M.D.   On: 01/24/2017 20:40    Procedures Procedures (including critical care time)  Medications Ordered in ED Medications - No data to display   Initial Impression / Assessment and Plan / ED Course  I have reviewed the triage vital signs and the nursing notes.  Pertinent labs & imaging results that were available during my care of the patient were reviewed by me and considered in my medical decision making (see chart for details).     Medications - No data to display  Patient Vitals for the past 24 hrs:  BP Temp Temp src Pulse Resp SpO2 Height Weight  01/24/17 2050 121/79 - - 71 18 100 % - -  01/24/17 1920 116/73 - - 64 12 97 % - -  01/24/17 1758 - - - - - - 5\' 6"  (1.676 m) 190 lb (86.2 kg)  01/24/17 1756 119/74 97.9 F (36.6 C) Oral 66 18 99 % - -    10:08 PM Reevaluation with update and discussion. After initial assessment and treatment, an updated evaluation reveals she remains comfortable has  no further complaints.  I reviewed her recent endoscopy findings with her, the form indicates that she was found to have gastritis, and endoscopy, and instructed to continue her current treatment.  Findings discussed with patient, and her daughter, all questions answered. Mattye Verdone L    Final Clinical Impressions(s) / ED Diagnoses   Final diagnoses:  Neck pain  Epigastric pain   Nonspecific neck and arm and leg discomfort.  CT cervical spine indicates mild spondylosis which may be causing cervical radiculopathy.  Doubt ACS, PE, pneumonia or unstable gastrointestinal disorder.  She is being actively worked up by gastroenterology.  She recently had a major cardiac evaluation, but plans on getting further evaluation for possible occult disease.  No indication for hospitalization or further intervention in the emergency department, at this time.  Nursing Notes Reviewed/ Care Coordinated Applicable Imaging Reviewed Interpretation of Laboratory Data incorporated into ED treatment  The patient appears reasonably screened and/or stabilized for discharge and I doubt any other medical condition or other [facility] requiring further screening, evaluation, or treatment in the ED at this time prior to discharge.  Plan: Home Medications-continue usual treatment; Home Treatments-rest; return here if the recommended treatment, does not improve the symptoms; Recommended follow up-PCP, as needed.  Follow-up with GI next week, and consider seeing cardiology for further testing.    New Prescriptions New Prescriptions   No medications on file     Mancel Bale, MD 01/24/17 2210

## 2017-02-13 ENCOUNTER — Other Ambulatory Visit: Payer: Self-pay

## 2017-02-13 ENCOUNTER — Encounter (HOSPITAL_COMMUNITY): Payer: Self-pay

## 2017-02-13 ENCOUNTER — Emergency Department (HOSPITAL_COMMUNITY)
Admission: EM | Admit: 2017-02-13 | Discharge: 2017-02-13 | Disposition: A | Discharge status: Home / Self Care | Payer: Medicare Other | Attending: Emergency Medicine | Admitting: Emergency Medicine

## 2017-02-13 DIAGNOSIS — R42 Dizziness and giddiness: Secondary | ICD-10-CM | POA: Insufficient documentation

## 2017-02-13 DIAGNOSIS — E119 Type 2 diabetes mellitus without complications: Secondary | ICD-10-CM | POA: Insufficient documentation

## 2017-02-13 DIAGNOSIS — Z794 Long term (current) use of insulin: Secondary | ICD-10-CM | POA: Insufficient documentation

## 2017-02-13 DIAGNOSIS — Z7982 Long term (current) use of aspirin: Secondary | ICD-10-CM | POA: Diagnosis not present

## 2017-02-13 DIAGNOSIS — Z79899 Other long term (current) drug therapy: Secondary | ICD-10-CM | POA: Diagnosis not present

## 2017-02-13 DIAGNOSIS — E039 Hypothyroidism, unspecified: Secondary | ICD-10-CM | POA: Insufficient documentation

## 2017-02-13 DIAGNOSIS — I10 Essential (primary) hypertension: Secondary | ICD-10-CM | POA: Diagnosis not present

## 2017-02-13 HISTORY — DX: Dizziness and giddiness: R42

## 2017-02-13 LAB — BASIC METABOLIC PANEL
Anion gap: 9 (ref 5–15)
BUN: 19 mg/dL (ref 6–20)
CHLORIDE: 103 mmol/L (ref 101–111)
CO2: 27 mmol/L (ref 22–32)
CREATININE: 0.98 mg/dL (ref 0.44–1.00)
Calcium: 9.2 mg/dL (ref 8.9–10.3)
GFR calc Af Amer: >60 mL/min (ref >60)
GFR calc non Af Amer: 57 mL/min — ABNORMAL LOW (ref >60)
Glucose, Bld: 141 mg/dL — ABNORMAL HIGH (ref 65–99)
Potassium: 3.6 mmol/L (ref 3.5–5.1)
SODIUM: 139 mmol/L (ref 135–145)

## 2017-02-13 LAB — CBC
HCT: 38.1 % (ref 36.0–46.0)
Hemoglobin: 12.1 g/dL (ref 12.0–15.0)
MCH: 29.7 pg (ref 26.0–34.0)
MCHC: 31.8 g/dL (ref 30.0–36.0)
MCV: 93.4 fL (ref 78.0–100.0)
PLATELETS: 165 10*3/uL (ref 150–400)
RBC: 4.08 MIL/uL (ref 3.87–5.11)
RDW: 13.2 % (ref 11.5–15.5)
WBC: 5.2 10*3/uL (ref 4.0–10.5)

## 2017-02-13 LAB — I-STAT TROPONIN, ED: Troponin i, poc: 0.01 ng/mL (ref 0.00–0.08)

## 2017-02-13 LAB — CBG MONITORING, ED: GLUCOSE-CAPILLARY: 126 mg/dL — AB (ref 65–99)

## 2017-02-13 NOTE — ED Notes (Signed)
Patient states she felt fine until she started taking the Ranexa prescribed by her cardiologist. Seems as if she is having some of the symptoms that were listed on the insert and she wants to make sure everything is ok.

## 2017-02-13 NOTE — ED Notes (Signed)
AVS reviewed with patient and family. Encouraged to follow up with cardiology regarding symptoms and next dose of Ranexa. Denies pain. Vitals taken. Wheelchair provided for transport to car.

## 2017-02-13 NOTE — ED Triage Notes (Signed)
Pt reports dizziness and generalized weakness. Pt denies cp at this time. Pt seen by her cardiologist on 5/22 and Rx'd meds for Angina. Pt A+OX4 and speaking in complete sentences.

## 2017-02-13 NOTE — Discharge Instructions (Signed)
Contact cardiologist today regarding your ED visit. Continue other home medications as previously prescribed. Return to ED for worsening chest pain, trouble breathing, coughing up blood, fever, loss of consciousness, numbness or weakness.

## 2017-02-13 NOTE — ED Provider Notes (Signed)
[facility] DEPT Provider Note   CSN: [id number] Arrival date & time: 02/13/17  [id number]     History   Chief Complaint Chief Complaint  Patient presents with  . Dizziness    HPI Sandra Reed is a 71 y.o. female.  HPI  Patient with a past medical history of diabetes and angina, presents with 12 hour history of lightheadedness and "twitching of my hands." She was prescribed Ranexa by her cardiologist yesterday to be taken twice daily. After taking the first dose last night, she states that she began having lightheadedness and "my hands were moving without me knowing." She states that she was prescribed this for the chronic chest pain that she has been experiencing. She denies any chest pain or shortness of breath within the last 24 hours. She states that she is compliant with all of her medications. She reports normal appetite and is eating and drinking well. Reports few episodes of diarrhea in the last day. Denies abdominal pain, nausea, vomiting, urinary complaints, numbness, weakness, injury, falls or loss of consciousness.  Past Medical History:  Diagnosis Date  . Diabetes mellitus without complication (HCC)   . GERD (gastroesophageal reflux disease)   . Thyroid disease   . Vertigo     Patient Active Problem List   Diagnosis Date Noted  . Chest pain 12/18/2016  . Diabetes mellitus (HCC) 12/18/2016  . HTN (hypertension) 12/18/2016  . GERD (gastroesophageal reflux disease) 12/18/2016  . Hypothyroidism 12/18/2016    Past Surgical History:  Procedure Laterality Date  . ABDOMINAL HYSTERECTOMY      OB History    No data available       Home Medications    Prior to Admission medications   Medication Sig Start Date End Date Taking? Authorizing Provider  acetaminophen (TYLENOL) 650 MG CR tablet Take 650 mg by mouth every 8 (eight) hours as needed for pain.   Yes [provider]  alum & mag hydroxide-simeth (MAALOX/MYLANTA) 200-200-20 MG/5ML suspension Take 30  mLs by mouth every 6 (six) hours as needed for indigestion. 12/19/16  Yes Sheikh, Omair Latif, DO  aspirin 81 MG chewable tablet Chew 81 mg by mouth at bedtime.    Yes [provider]  gabapentin (NEURONTIN) 300 MG capsule Take 300 mg by mouth at bedtime.    Yes [provider]  insulin glargine (LANTUS) 100 unit/mL SOPN Inject 45 Units into the skin at bedtime.   Yes [provider]  levothyroxine (SYNTHROID, LEVOTHROID) 50 MCG tablet Take 50 mcg by mouth daily before breakfast.   Yes [provider]  lisinopril-hydrochlorothiazide (PRINZIDE,ZESTORETIC) 20-25 MG tablet Take 1 tablet by mouth daily.    Yes [provider]  metFORMIN (GLUCOPHAGE-XR) 500 MG 24 hr tablet Take 1,000 mg by mouth 2 (two) times daily.  08/27/16  Yes [provider]  metoprolol (LOPRESSOR) 50 MG tablet Take 50 mg by mouth daily.    Yes [provider]  montelukast (SINGULAIR) 10 MG tablet Take 1 tablet (10 mg total) by mouth at bedtime. Patient taking differently: Take 10 mg by mouth at bedtime as needed (for allergies).  07/27/16  Yes Sherren MochaShaw, Eva N, MD  omeprazole (PRILOSEC) 40 MG capsule Take 40 mg by mouth daily.   Yes [provider]  pentoxifylline (TRENTAL) 400 MG CR tablet Take 800 mg by mouth 2 (two) times daily with a meal.    Yes [provider]  pravastatin (PRAVACHOL) 20 MG tablet Take 20 mg by mouth at bedtime.  Yes [provider]  ranolazine (RANEXA) 500 MG 12 hr tablet Take 500 mg by mouth once.   Yes [provider]  repaglinide (PRANDIN) 2 MG tablet Take 4 mg by mouth 3 (three) times daily before meals.    Yes [provider]  sucralfate (CARAFATE) 1 g tablet Take 1 tablet (1 g total) by mouth 3 (three) times daily with meals. 01/18/17  Yes Antony Madura, PA-C    Family History Family History  Problem Relation Age of Onset  . Diabetes Mother   . Hyperlipidemia Mother   . Hypertension Mother   .  Cancer Father     Social History Social History  Substance Use Topics  . Smoking status: Never Smoker  . Smokeless tobacco: Never Used  . Alcohol use No     Allergies   Patient has no known allergies.   Review of Systems Review of Systems  Constitutional: Negative for appetite change, chills and fever.  HENT: Negative for ear pain, rhinorrhea, sneezing and sore throat.   Eyes: Negative for photophobia and visual disturbance.  Respiratory: Negative for cough, chest tightness, shortness of breath and wheezing.   Cardiovascular: Negative for chest pain and palpitations.  Gastrointestinal: Negative for abdominal pain, blood in stool, constipation, diarrhea, nausea and vomiting.  Genitourinary: Negative for dysuria, hematuria and urgency.  Musculoskeletal: Negative for arthralgias and myalgias.  Skin: Negative for rash.  Neurological: Positive for dizziness, tremors and light-headedness. Negative for syncope and weakness.     Physical Exam Updated Vital Signs BP 128/74   Pulse 73   Temp 98 F (36.7 C)   Resp 16   SpO2 99%   Physical Exam  Constitutional: She is oriented to person, place, and time. She appears well-developed and well-nourished. No distress.  HENT:  Head: Normocephalic and atraumatic.  Nose: Nose normal.  Eyes: Conjunctivae and EOM are normal. Pupils are equal, round, and reactive to light. Right eye exhibits no discharge. Left eye exhibits no discharge. No scleral icterus.  Neck: Normal range of motion. Neck supple.  Cardiovascular: Normal rate, regular rhythm, normal heart sounds and intact distal pulses.  Exam reveals no gallop and no friction rub.   No murmur heard. Pulmonary/Chest: Effort normal and breath sounds normal. No respiratory distress.  Abdominal: Soft. Bowel sounds are normal. She exhibits no distension. There is no tenderness. There is no guarding.  Musculoskeletal: Normal range of motion. She exhibits no edema.  Neurological: She is  alert and oriented to person, place, and time. No sensory deficit. She exhibits normal muscle tone. Coordination normal.  Skin: Skin is warm and dry. No rash noted.  Psychiatric: She has a normal mood and affect.  Nursing note and vitals reviewed.    ED Treatments / Results  Labs (all labs ordered are listed, but only abnormal results are displayed) Labs Reviewed  BASIC METABOLIC PANEL - Abnormal; Notable for the following:       Result Value   Glucose, Bld 141 (*)    GFR calc non Af Amer 57 (*)    All other components within normal limits  CBG MONITORING, ED - Abnormal; Notable for the following:    Glucose-Capillary 126 (*)    All other components within normal limits  CBC  URINALYSIS, ROUTINE W REFLEX MICROSCOPIC  I-STAT TROPOININ, ED    EKG  EKG Interpretation  Date/Time:  Wednesday Feb 13 2017 03:52:08 EDT Ventricular Rate:  75 PR Interval:    QRS Duration: 85 QT Interval:  410 QTC  Calculation: 458 R Axis:   40 Text Interpretation:  Sinus rhythm Low voltage, precordial leads Borderline T abnormalities, diffuse leads Nonspecific ST and T wave abnormality Confirmed by Molpus, John ([id number]) on 02/13/2017 5:20:50 AM       Radiology No results found.  Procedures Procedures (including critical care time)  Medications Ordered in ED Medications - No data to display   Initial Impression / Assessment and Plan / ED Course  I have reviewed the triage vital signs and the nursing notes.  Pertinent labs & imaging results that were available during my care of the patient were reviewed by me and considered in my medical decision making (see chart for details).     Patient presents with 12 hour history of feeling lightheaded and muscle twitching in her hands. Patient was recently prescribed Ranexa by cardiologist and is unsure if this is what causing her symptoms. Upon research of side effects of Ranexa, dizziness was one of the main side effects. Her lab work today was  normal, including CBC, BMP, CBG. EKG showed borderline T abnormalities but is consistent with her previous tracings. Troponin today was 0.01. I advised patient that she should contact her cardiologist today and inform them of her symptoms and her ED visit. I reassured her of her negative workup today. Symptoms could be due to side effects of her new medication. No further imaging or testing warranted at this time. Strict return precautions given.  Final Clinical Impressions(s) / ED Diagnoses   Final diagnoses:  Dizziness    New Prescriptions New Prescriptions   No medications on file     Dietrich Pates, PA-C 02/13/17 0724    Molpus, Jonny Ruiz, MD 02/13/17 [phone]

## 2017-11-26 DIAGNOSIS — J111 Influenza due to unidentified influenza virus with other respiratory manifestations: Secondary | ICD-10-CM | POA: Insufficient documentation

## 2018-03-23 DIAGNOSIS — G459 Transient cerebral ischemic attack, unspecified: Secondary | ICD-10-CM | POA: Insufficient documentation

## 2018-07-08 ENCOUNTER — Emergency Department (HOSPITAL_COMMUNITY)
Admission: EM | Admit: 2018-07-08 | Discharge: 2018-07-08 | Disposition: A | Discharge status: Home / Self Care | Payer: Medicare Other | Attending: Emergency Medicine | Admitting: Emergency Medicine

## 2018-07-08 ENCOUNTER — Emergency Department (HOSPITAL_COMMUNITY): Payer: Medicare Other

## 2018-07-08 ENCOUNTER — Encounter (HOSPITAL_COMMUNITY): Payer: Self-pay | Admitting: Emergency Medicine

## 2018-07-08 DIAGNOSIS — E039 Hypothyroidism, unspecified: Secondary | ICD-10-CM | POA: Diagnosis not present

## 2018-07-08 DIAGNOSIS — Z794 Long term (current) use of insulin: Secondary | ICD-10-CM | POA: Diagnosis not present

## 2018-07-08 DIAGNOSIS — M545 Low back pain: Secondary | ICD-10-CM | POA: Diagnosis present

## 2018-07-08 DIAGNOSIS — Z79899 Other long term (current) drug therapy: Secondary | ICD-10-CM | POA: Diagnosis not present

## 2018-07-08 DIAGNOSIS — M5432 Sciatica, left side: Secondary | ICD-10-CM | POA: Diagnosis not present

## 2018-07-08 DIAGNOSIS — K5900 Constipation, unspecified: Secondary | ICD-10-CM | POA: Insufficient documentation

## 2018-07-08 DIAGNOSIS — Z7982 Long term (current) use of aspirin: Secondary | ICD-10-CM | POA: Insufficient documentation

## 2018-07-08 DIAGNOSIS — E119 Type 2 diabetes mellitus without complications: Secondary | ICD-10-CM | POA: Diagnosis not present

## 2018-07-08 DIAGNOSIS — I10 Essential (primary) hypertension: Secondary | ICD-10-CM | POA: Diagnosis not present

## 2018-07-08 DIAGNOSIS — R52 Pain, unspecified: Secondary | ICD-10-CM

## 2018-07-08 MED ORDER — ACETAMINOPHEN 500 MG PO TABS
1000.0000 mg | ORAL_TABLET | Freq: Once | ORAL | Status: AC
  Administered 2018-07-08: 1000 mg via ORAL
  Filled 2018-07-08: qty 2

## 2018-07-08 MED ORDER — DICLOFENAC SODIUM 1 % TD GEL: 4.0000 g | Freq: Four times a day (QID) | TRANSDERMAL | 0 refills | Status: AC

## 2018-07-08 MED ORDER — DIAZEPAM 2 MG PO TABS
2.0000 mg | ORAL_TABLET | Freq: Once | ORAL | Status: AC
  Administered 2018-07-08: 2 mg via ORAL
  Filled 2018-07-08: qty 1

## 2018-07-08 MED ORDER — OXYCODONE HCL 5 MG PO TABS
2.5000 mg | ORAL_TABLET | Freq: Once | ORAL | Status: AC
  Administered 2018-07-08: 2.5 mg via ORAL
  Filled 2018-07-08: qty 1

## 2018-07-08 NOTE — Discharge Instructions (Signed)
Take tylenol 1000mg(2 extra strength) four times a day.  ° °

## 2018-07-08 NOTE — ED Triage Notes (Signed)
Pt c/o lower back pain for couple days that is worse with sitting. Reports that pains now going down left leg. Pt reports that been taking miralax past 2 days for constipation. Had BM today.

## 2018-07-08 NOTE — ED Provider Notes (Signed)
[facility]-EMERGENCY DEPT Provider Note   CSN: [id number] Arrival date & time: 07/08/18  1634     History   Chief Complaint Chief Complaint  Patient presents with  . Back Pain  . Leg Pain    HPI Sandra Reed is a 72 y.o. female.  72 yo F with a chief complaint of left-sided low back pain.  Going on for the past couple days.  Worse with sitting.  She feels the pain radiated down her left leg to the back of the knee.  Denies trauma denies fever denies recent injection.  Has been having some right lower abdominal pain.  She has been constipated for the past week and took MiraLAX with some improvement.  She denies numbness or weakness the legs.  Denies loss of bowel or bladder.  Denies loss of perirectal sensation.  The history is provided by the patient.  Illness  This is a new problem. The current episode started more than 2 days ago. The problem occurs constantly. The problem has been gradually worsening. Pertinent negatives include no chest pain, no abdominal pain, no headaches and no shortness of breath. Nothing aggravates the symptoms. Nothing relieves the symptoms. She has tried nothing for the symptoms. The treatment provided no relief.    Past Medical History:  Diagnosis Date  . Diabetes mellitus without complication (HCC)   . GERD (gastroesophageal reflux disease)   . Thyroid disease   . Vertigo     Patient Active Problem List   Diagnosis Date Noted  . Chest pain 12/18/2016  . Diabetes mellitus (HCC) 12/18/2016  . HTN (hypertension) 12/18/2016  . GERD (gastroesophageal reflux disease) 12/18/2016  . Hypothyroidism 12/18/2016    Past Surgical History:  Procedure Laterality Date  . ABDOMINAL HYSTERECTOMY       OB History   None      Home Medications    Prior to Admission medications   Medication Sig Start Date End Date Taking? Authorizing Provider  acetaminophen (TYLENOL) 650 MG CR tablet Take 650 mg by mouth every 8 (eight) hours  as needed for pain.   Yes [provider]  aspirin 81 MG chewable tablet Chew 81 mg by mouth at bedtime.    Yes [provider]  DYMISTA 137-50 MCG/ACT SUSP Place 1 spray into both nostrils 2 (two) times daily. 05/12/18  Yes [provider]  gabapentin (NEURONTIN) 300 MG capsule Take 300 mg by mouth at bedtime.    Yes [provider]  GLIPIZIDE XL 5 MG 24 hr tablet Take 5 mg by mouth daily. 05/20/18  Yes [provider]  insulin glargine (LANTUS) 100 unit/mL SOPN Inject 45 Units into the skin at bedtime.   Yes [provider]  JARDIANCE 10 MG TABS tablet Take 10 mg by mouth daily. 06/12/18  Yes [provider]  levothyroxine (SYNTHROID, LEVOTHROID) 50 MCG tablet Take 50 mcg by mouth daily before breakfast.   Yes [provider]  lisinopril-hydrochlorothiazide (PRINZIDE,ZESTORETIC) 10-12.5 MG tablet Take 1 tablet by mouth daily.    Yes [provider]  metFORMIN (GLUCOPHAGE-XR) 500 MG 24 hr tablet Take 1,000 mg by mouth 2 (two) times daily.  08/27/16  Yes [provider]  metoprolol (LOPRESSOR) 50 MG tablet Take 50 mg by mouth daily.    Yes [provider]  nitroGLYCERIN (NITROSTAT) 0.4 MG SL tablet Place 0.4 mg under the tongue. Every 5 minutes as needed for chest pain. 06/03/18  Yes [provider]  omeprazole (PRILOSEC) 40  MG capsule Take 40 mg by mouth daily.   Yes [provider]  pantoprazole (PROTONIX) 40 MG tablet Take 40 mg by mouth daily. 06/17/18  Yes [provider]  pentoxifylline (TRENTAL) 400 MG CR tablet Take 400 mg by mouth 2 (two) times daily with a meal.    Yes [provider]  pravastatin (PRAVACHOL) 20 MG tablet Take 20 mg by mouth at bedtime.    Yes [provider]  sucralfate (CARAFATE) 1 g tablet Take 1 tablet (1 g total) by mouth 3 (three) times daily with meals. Patient taking differently: Take 1 g by mouth 4 (four) times daily.  01/18/17   Yes Antony Madura, PA-C  alum & mag hydroxide-simeth (MAALOX/MYLANTA) 200-200-20 MG/5ML suspension Take 30 mLs by mouth every 6 (six) hours as needed for indigestion. 12/19/16   Marguerita Merles Latif, DO  diclofenac sodium (VOLTAREN) 1 % GEL Apply 4 g topically 4 (four) times daily. 07/08/18   Melene Plan, DO  montelukast (SINGULAIR) 10 MG tablet Take 1 tablet (10 mg total) by mouth at bedtime. Patient not taking: Reported on 07/08/2018 07/27/16   Sherren Mocha, MD    Family History Family History  Problem Relation Age of Onset  . Diabetes Mother   . Hyperlipidemia Mother   . Hypertension Mother   . Cancer Father     Social History Social History   Tobacco Use  . Smoking status: Never Smoker  . Smokeless tobacco: Never Used  Substance Use Topics  . Alcohol use: No  . Drug use: No     Allergies   Patient has no known allergies.   Review of Systems Review of Systems  Constitutional: Negative for chills and fever.  HENT: Negative for congestion and rhinorrhea.   Eyes: Negative for redness and visual disturbance.  Respiratory: Negative for shortness of breath and wheezing.   Cardiovascular: Negative for chest pain and palpitations.  Gastrointestinal: Negative for abdominal pain, nausea and vomiting.  Genitourinary: Negative for dysuria and urgency.  Musculoskeletal: Positive for back pain. Negative for arthralgias and myalgias.  Skin: Negative for pallor and wound.  Neurological: Negative for dizziness and headaches.     Physical Exam Updated Vital Signs BP 121/74   Pulse 71   Temp 98.4 F (36.9 C) (Oral)   Resp 17   SpO2 98%   Physical Exam  Constitutional: She is oriented to person, place, and time. She appears well-developed and well-nourished. No distress.  HENT:  Head: Normocephalic and atraumatic.  Eyes: Pupils are equal, round, and reactive to light. EOM are normal.  Neck: Normal range of motion. Neck supple.  Cardiovascular: Normal rate and regular rhythm.  Exam reveals no gallop and no friction rub.  No murmur heard. Pulmonary/Chest: Effort normal. She has no wheezes. She has no rales.  Abdominal: Soft. She exhibits no distension. There is no tenderness. There is no guarding.  Musculoskeletal: She exhibits no edema or tenderness.  No midline spinal tenderness.  Negative straight leg raise test.  Reflexes are 2+ and equal bilaterally.  Pulse motor and sensation is intact distally.  Patient ambulates without difficulty.  Neurological: She is alert and oriented to person, place, and time.  Skin: Skin is warm and dry. She is not diaphoretic.  Psychiatric: She has a normal mood and affect. Her behavior is normal.  Nursing note and vitals reviewed.    ED Treatments / Results  Labs (all labs ordered are listed, but only abnormal results are displayed) Labs Reviewed - No  data to display  EKG None  Radiology Ct L-spine No Charge  Result Date: 07/08/2018 CLINICAL DATA:  Initial evaluation for acute back pain, worse with sitting, extending into the left lower extremity. EXAM: CT ABDOMEN AND PELVIS WITHOUT CONTRAST TECHNIQUE: Multidetector CT imaging of the abdomen and pelvis was performed following the standard protocol without IV contrast. COMPARISON:  None. FINDINGS: CT ABDOMEN AND PELVIS Lower chest: Visualized lung bases are clear. Hepatobiliary: 17 mm cyst noted within the central right hepatic lobe. Limited noncontrast evaluation of the liver otherwise unremarkable. Gallbladder contracted without acute abnormality. No biliary dilatation. Pancreas: Pancreas within normal limits. Spleen: Spleen within normal limits. Adrenals/Urinary Tract: Adrenal glands are normal. Kidneys equal in size without evidence for nephrolithiasis or hydronephrosis. No radiopaque calculi seen along the course of either renal collecting system. No hydroureter. Partially distended bladder within normal limits. No layering stones within the bladder lumen. Stomach/Bowel: Small  hiatal hernia noted. Stomach otherwise unremarkable. No evidence for bowel obstruction. No acute inflammatory changes seen about the bowels. Single prominent colonic diverticulum noted at the ascending colon. Moderate retained stool within the colon. Remote lower midline incision with associated diastasis of the rectus abdominus musculature with superimposed small fat containing paraumbilical hernia noted. No associated inflammation. Vascular/Lymphatic: Mild atherosclerotic change within the intra-abdominal aorta. No aneurysm. No adenopathy. Reproductive: Uterus is absent.  Ovaries not discretely identified. Other: No free air or fluid. Musculoskeletal: No acute osseous abnormality. No discrete lytic or blastic osseous lesions. CT LUMBAR SPINE: Segmentation: Normal segmentation. Lowest well-formed disc labeled the L5-S1 level. Alignment: Trace scoliosis. Vertebral bodies otherwise normally aligned with preservation of the normal lumbar lordosis. No listhesis or malalignment. Vertebrae: Vertebral body heights maintained without evidence for acute or chronic fracture. No discrete osseous lesions. Visualized sacrum and pelvis intact. SI joints approximated symmetric. Paraspinal and other soft tissues: Paraspinous soft tissues within normal limits. Disc levels: L1-2:  Mild bilateral facet hypertrophy. No stenosis. L2-3: Chronic intervertebral disc space narrowing with diffuse disc bulge and disc desiccation. Mild bilateral facet hypertrophy. No significant spinal stenosis. Foramina remain patent. L3-4: Chronic intervertebral disc space narrowing with diffuse disc bulge and disc desiccation. Mild facet hypertrophy. No significant spinal stenosis. Foramina remain patent. L4-5: Chronic intervertebral disc space narrowing with diffuse disc bulge with disc desiccation. Reactive endplate changes marginal endplate osteophytic spurring. Moderate facet and ligament flavum hypertrophy. Resultant mild to moderate canal with  bilateral subarticular stenosis. Moderate bilateral L4 foraminal narrowing, left greater than right. L5-S1: Mild diffuse disc bulge. Superimposed small central disc protrusion closely approximates the descending S1 nerve roots bilaterally without frank impingement. Moderate facet hypertrophy, worse on the right. Central canal remains patent. Mild left L5 foraminal stenosis. IMPRESSION: CT ABDOMEN AND PELVIS: 1. No CT evidence for nephrolithiasis or obstructive. 2. No other acute intra-abdominal or pelvic process identified. 3. Moderate retained stool within the colon, which could reflect constipation. CT LUMBAR SPINE: 1. No acute abnormality within the lumbar spine. 2. 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 3. Small central disc protrusion at L5-S1, closely approximating and potentially affecting either of the descending S1 nerve roots bilaterally. Electronically Signed   By: Rise Mu M.D.   On: 07/08/2018 19:51   Ct Renal Stone Study  Result Date: 07/08/2018 CLINICAL DATA:  Initial evaluation for acute back pain, worse with sitting, extending into the left lower extremity. EXAM: CT ABDOMEN AND PELVIS WITHOUT  CONTRAST TECHNIQUE: Multidetector CT imaging of the abdomen and pelvis was performed following the standard protocol without IV contrast. COMPARISON:  None. FINDINGS: CT ABDOMEN AND PELVIS Lower chest: Visualized lung bases are clear. Hepatobiliary: 17 mm cyst noted within the central right hepatic lobe. Limited noncontrast evaluation of the liver otherwise unremarkable. Gallbladder contracted without acute abnormality. No biliary dilatation. Pancreas: Pancreas within normal limits. Spleen: Spleen within normal limits. Adrenals/Urinary Tract: Adrenal glands are normal. Kidneys equal in size without evidence for nephrolithiasis or  hydronephrosis. No radiopaque calculi seen along the course of either renal collecting system. No hydroureter. Partially distended bladder within normal limits. No layering stones within the bladder lumen. Stomach/Bowel: Small hiatal hernia noted. Stomach otherwise unremarkable. No evidence for bowel obstruction. No acute inflammatory changes seen about the bowels. Single prominent colonic diverticulum noted at the ascending colon. Moderate retained stool within the colon. Remote lower midline incision with associated diastasis of the rectus abdominus musculature with superimposed small fat containing paraumbilical hernia noted. No associated inflammation. Vascular/Lymphatic: Mild atherosclerotic change within the intra-abdominal aorta. No aneurysm. No adenopathy. Reproductive: Uterus is absent.  Ovaries not discretely identified. Other: No free air or fluid. Musculoskeletal: No acute osseous abnormality. No discrete lytic or blastic osseous lesions. CT LUMBAR SPINE: Segmentation: Normal segmentation. Lowest well-formed disc labeled the L5-S1 level. Alignment: Trace scoliosis. Vertebral bodies otherwise normally aligned with preservation of the normal lumbar lordosis. No listhesis or malalignment. Vertebrae: Vertebral body heights maintained without evidence for acute or chronic fracture. No discrete osseous lesions. Visualized sacrum and pelvis intact. SI joints approximated symmetric. Paraspinal and other soft tissues: Paraspinous soft tissues within normal limits. Disc levels: L1-2:  Mild bilateral facet hypertrophy. No stenosis. L2-3: Chronic intervertebral disc space narrowing with diffuse disc bulge and disc desiccation. Mild bilateral facet hypertrophy. No significant spinal stenosis. Foramina remain patent. L3-4: Chronic intervertebral disc space narrowing with diffuse disc bulge and disc desiccation. Mild facet hypertrophy. No significant spinal stenosis. Foramina remain patent. L4-5: Chronic intervertebral  disc space narrowing with diffuse disc bulge with disc desiccation. Reactive endplate changes marginal endplate osteophytic spurring. Moderate facet and ligament flavum hypertrophy. Resultant mild to moderate canal with bilateral subarticular stenosis. Moderate bilateral L4 foraminal narrowing, left greater than right. L5-S1: Mild diffuse disc bulge. Superimposed small central disc protrusion closely approximates the descending S1 nerve roots bilaterally without frank impingement. Moderate facet hypertrophy, worse on the right. Central canal remains patent. Mild left L5 foraminal stenosis. IMPRESSION: CT ABDOMEN AND PELVIS: 1. No CT evidence for nephrolithiasis or obstructive. 2. No other acute intra-abdominal or pelvic process identified. 3. Moderate retained stool within the colon, which could reflect constipation. CT LUMBAR SPINE: 1. No acute abnormality within the lumbar spine. 2. 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 3. Small central disc protrusion at L5-S1, closely approximating and potentially affecting either of the descending S1 nerve roots bilaterally. Electronically Signed   By: Rise Mu M.D.   On: 07/08/2018 19:51    Procedures Procedures (including critical care time)  Medications Ordered in ED Medications  acetaminophen (TYLENOL) tablet 1,000 mg (1,000 mg Oral Given 07/08/18 1850)  oxyCODONE (Oxy IR/ROXICODONE) immediate release tablet 2.5 mg (2.5 mg Oral Given 07/08/18 1903)  diazepam (VALIUM) tablet 2 mg (2 mg Oral Given 07/08/18 1904)     Initial Impression / Assessment and Plan / ED Course  I have reviewed the triage vital  signs and the nursing notes.  Pertinent labs & imaging results that were available during my care of the patient were reviewed by me and considered in my medical decision making (see chart for  details).     72 yo F with a chief complaint of left-sided low back pain.  Going on for the past 3 to 4 days.  Started after she was trying to bear down on the toilet.  I suspect that this is musculoskeletal.  However based on her age will obtain CT imaging.  CT negative for acute intraabdominal process.  CT L spine with disc protrusion at L5-S1.  Will have her follow up with her PCP.  CT scan also concerning for possible constipation.  Patient will do MiraLAX trial at home.  8:36 PM:  I have discussed the diagnosis/risks/treatment options with the patient and family and believe the pt to be eligible for discharge home to follow-up with PCP. We also discussed returning to the ED immediately if new or worsening sx occur. We discussed the sx which are most concerning (e.g., sudden worsening pain, fever, inability to tolerate by mouth) that necessitate immediate return. Medications administered to the patient during their visit and any new prescriptions provided to the patient are listed below.  Medications given during this visit Medications  acetaminophen (TYLENOL) tablet 1,000 mg (1,000 mg Oral Given 07/08/18 1850)  oxyCODONE (Oxy IR/ROXICODONE) immediate release tablet 2.5 mg (2.5 mg Oral Given 07/08/18 1903)  diazepam (VALIUM) tablet 2 mg (2 mg Oral Given 07/08/18 1904)     The patient appears reasonably screen and/or stabilized for discharge and I doubt any other medical condition or other [facility] requiring further screening, evaluation, or treatment in the ED at this time prior to discharge.    Final Clinical Impressions(s) / ED Diagnoses   Final diagnoses:  Sciatica of left side    ED Discharge Orders         Ordered    diclofenac sodium (VOLTAREN) 1 % GEL  4 times daily     07/08/18 2026           Melene Plan, DO 07/08/18 2036

## 2020-07-19 IMAGING — CT CT RENAL STONE PROTOCOL
3 of 7 series · 14 of 46 positions shown, 16 images · non-contrast
Comparison: None.

CLINICAL DATA: Initial evaluation for acute back pain, worse with
sitting, extending into the left lower extremity.

EXAM:
CT ABDOMEN AND PELVIS WITHOUT CONTRAST
TECHNIQUE: Multidetector CT imaging of the abdomen and pelvis was performed
following the standard protocol without IV contrast.

[Series 3: axial st · axial · 0.85mm/px · z∈[+1060,+1410]mm · 9 of 88 slices shown, 11 images]
[im 9/88  soft-tissue]
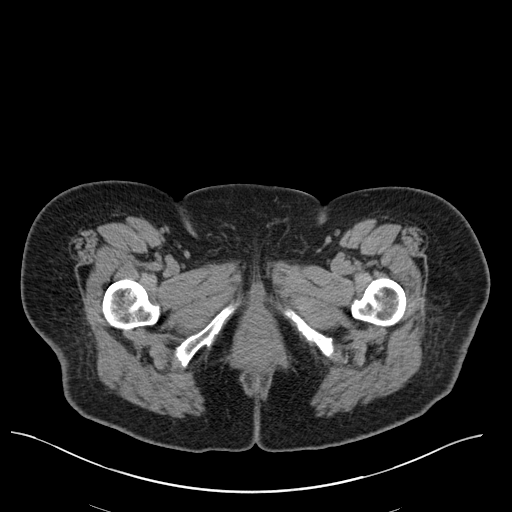
[im 9/88  bone]
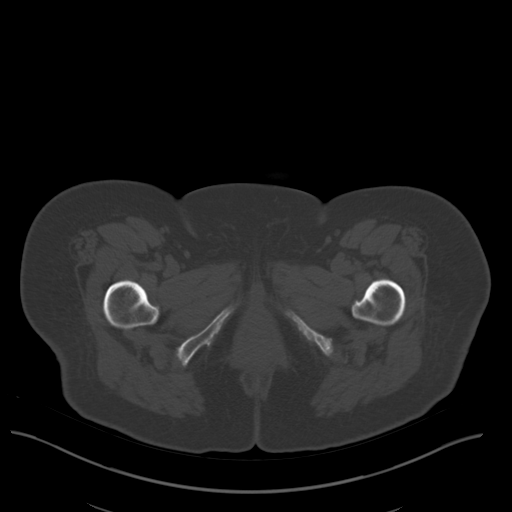
[im 18/88  soft-tissue]
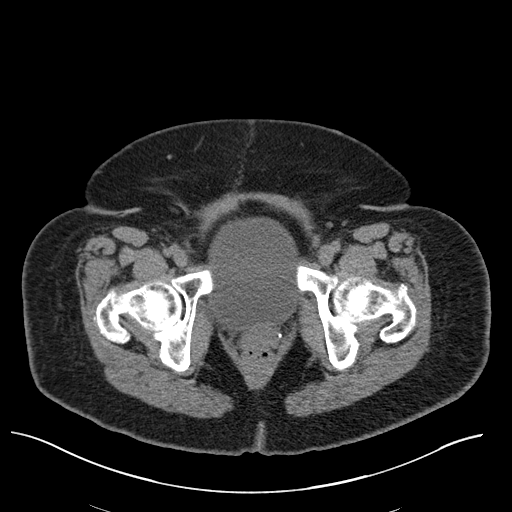
[im 27/88  soft-tissue]
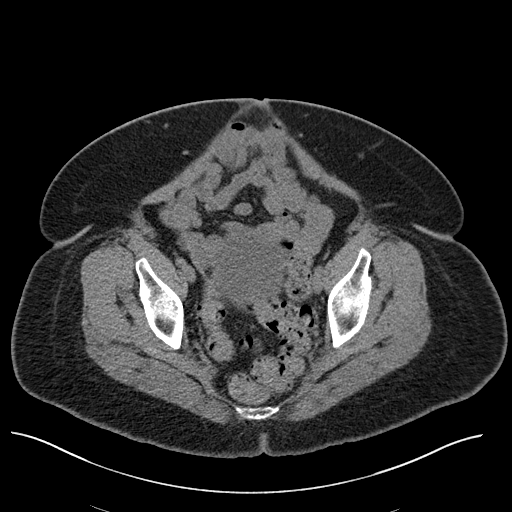
[im 35/88  soft-tissue]
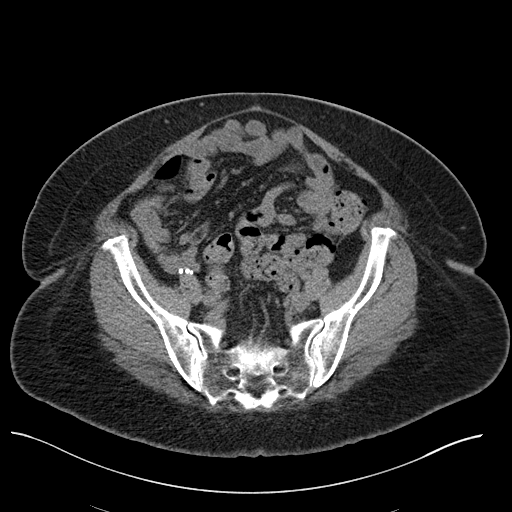
[im 44/88  soft-tissue]
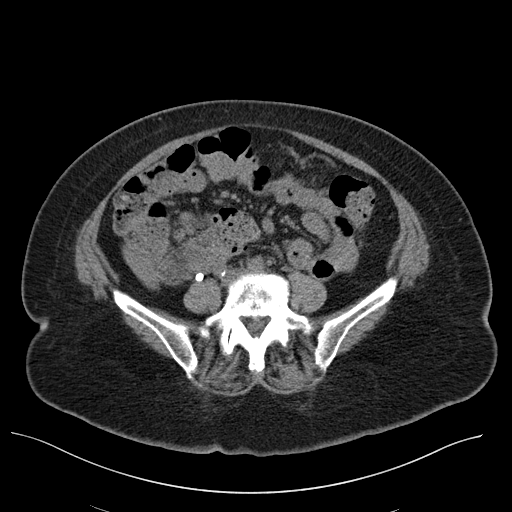
[im 53/88  soft-tissue]
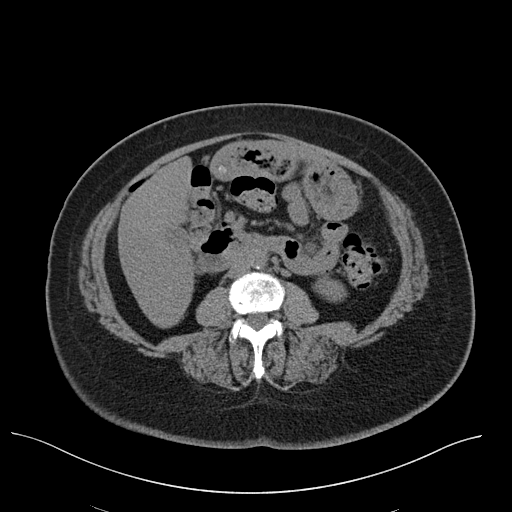
[im 61/88  soft-tissue]
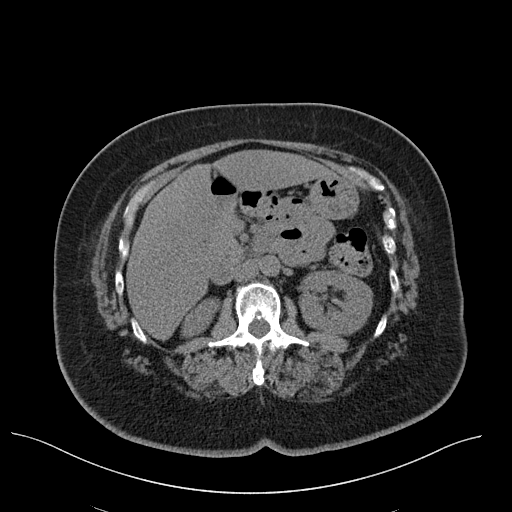
[im 70/88  soft-tissue]
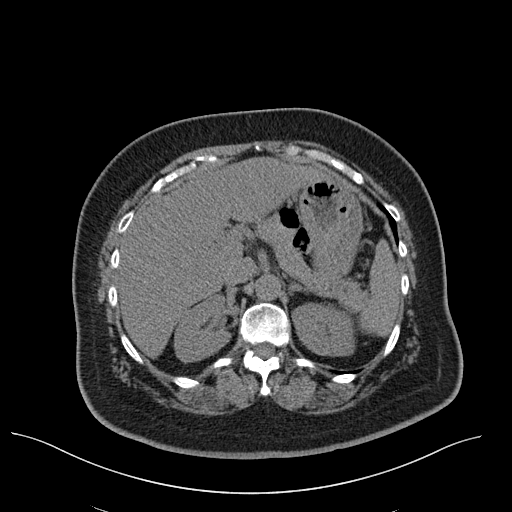
[im 79/88  soft-tissue]
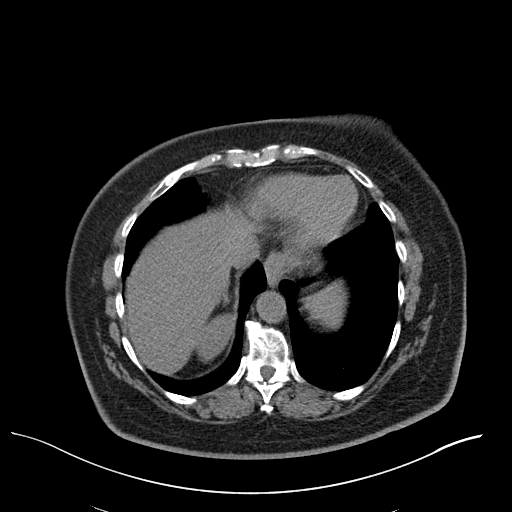
[im 79/88  bone]
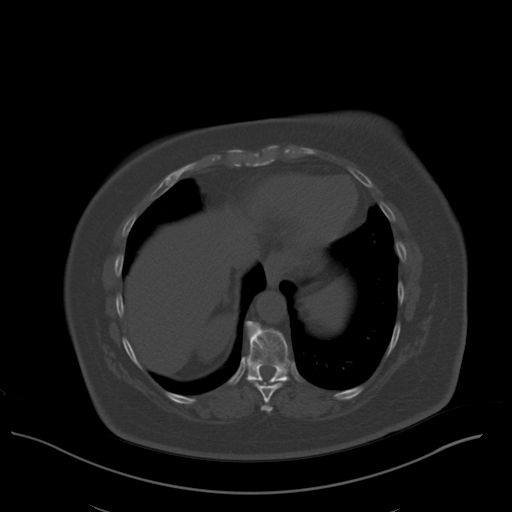

[Series 5: lung bases · axial · 0.56mm/px · z∈[+1363,+1381]mm · 2 of 56 slices shown]
[im 10/56  bone]
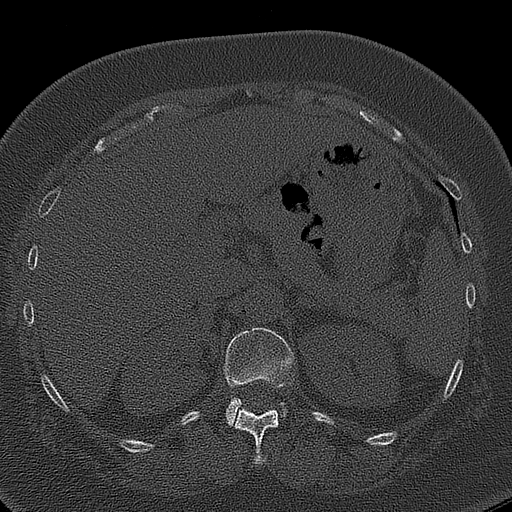
[im 19/56  bone]
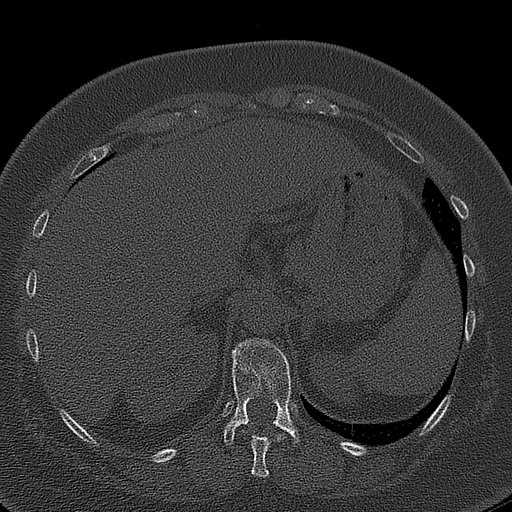

[Series 6: coronal · coronal · 0.67mm/px · 3 of 151 slices shown]
[im 51/151  soft-tissue]
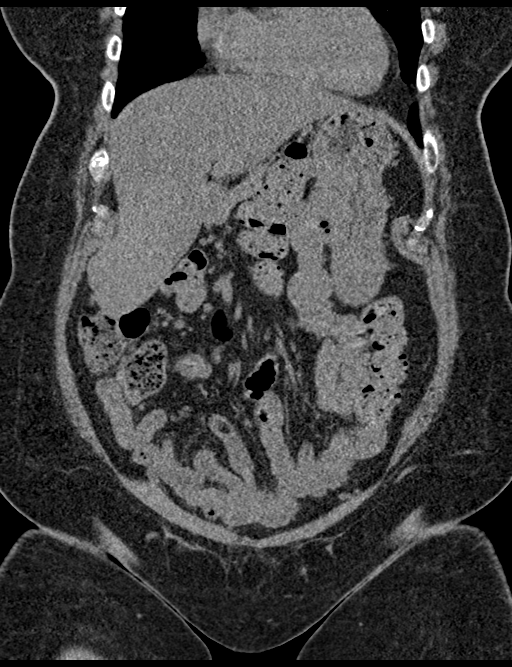
[im 76/151  soft-tissue]
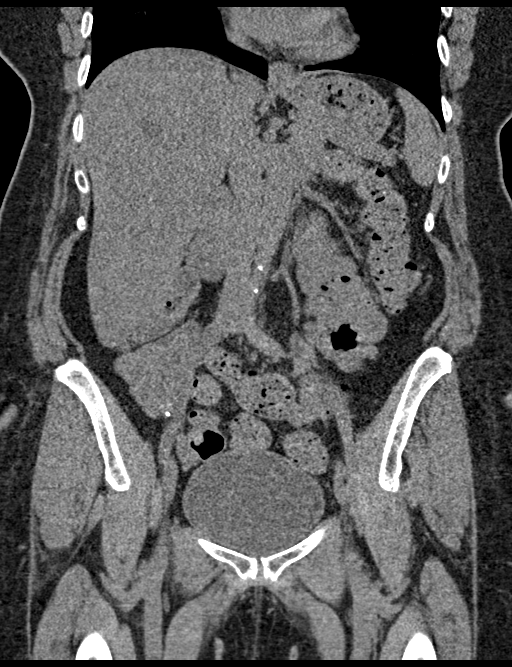
[im 101/151  soft-tissue]
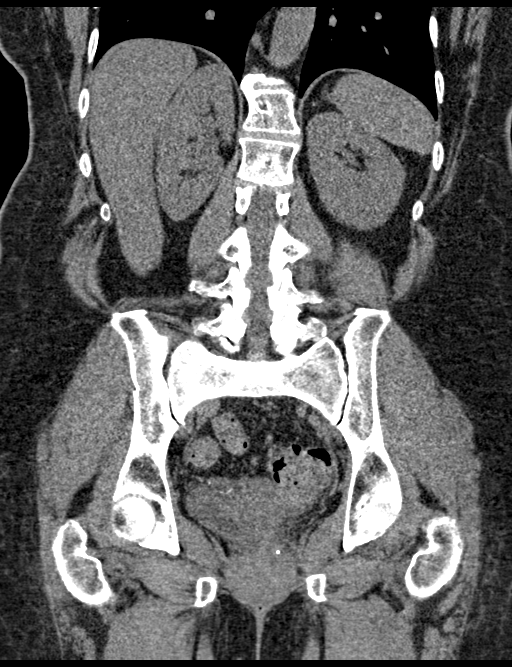

[14 of 46 positions shown; findings below may reference images not displayed]

FINDINGS: CT ABDOMEN AND PELVIS

Lower chest: Visualized lung bases are clear.

Hepatobiliary: 17 mm cyst noted within the central right hepatic
lobe. Limited noncontrast evaluation of the liver otherwise
unremarkable. Gallbladder contracted without acute abnormality. No
biliary dilatation.

Pancreas: Pancreas within normal limits.

Spleen: Spleen within normal limits.

Adrenals/Urinary Tract: Adrenal glands are normal. Kidneys equal in
size without evidence for nephrolithiasis or hydronephrosis. No
radiopaque calculi seen along the course of either renal collecting
system. No hydroureter. Partially distended bladder within normal
limits. No layering stones within the bladder lumen.

Stomach/Bowel: Small hiatal hernia noted. Stomach otherwise
unremarkable. No evidence for bowel obstruction. No acute
inflammatory changes seen about the bowels. Single prominent colonic
diverticulum noted at the ascending colon. Moderate retained stool
within the colon. Remote lower midline incision with associated
diastasis of the rectus abdominus musculature with superimposed
small fat containing paraumbilical hernia noted. No associated
inflammation.

Vascular/Lymphatic: Mild atherosclerotic change within the
intra-abdominal aorta. No aneurysm. No adenopathy.

Reproductive: Uterus is absent.  Ovaries not discretely identified.

Other: No free air or fluid.

Musculoskeletal: No acute osseous abnormality. No discrete lytic or
blastic osseous lesions.

CT LUMBAR SPINE:

Segmentation: Normal segmentation. Lowest well-formed disc labeled
the L5-S1 level.

Alignment: Trace scoliosis. Vertebral bodies otherwise normally
aligned with preservation of the normal lumbar lordosis. No
listhesis or malalignment.

Vertebrae: Vertebral body heights maintained without evidence for
acute or chronic fracture. No discrete osseous lesions. Visualized
sacrum and pelvis intact. SI joints approximated symmetric.

Paraspinal and other soft tissues: Paraspinous soft tissues within
normal limits.

Disc levels:

L1-2:  Mild bilateral facet hypertrophy. No stenosis.

L2-3: Chronic intervertebral disc space narrowing with diffuse disc
bulge and disc desiccation. Mild bilateral facet hypertrophy. No
significant spinal stenosis. Foramina remain patent.

L3-4: Chronic intervertebral disc space narrowing with diffuse disc
bulge and disc desiccation. Mild facet hypertrophy. No significant
spinal stenosis. Foramina remain patent.

L4-5: Chronic intervertebral disc space narrowing with diffuse disc
bulge with disc desiccation. Reactive endplate changes marginal
endplate osteophytic spurring. Moderate facet and ligament flavum
hypertrophy. Resultant mild to moderate canal with bilateral
subarticular stenosis. Moderate bilateral L4 foraminal narrowing,
left greater than right.

L5-S1: Mild diffuse disc bulge. Superimposed small central disc
protrusion closely approximates the descending S1 nerve roots
bilaterally without frank impingement. Moderate facet hypertrophy,
worse on the right. Central canal remains patent. Mild left L5
foraminal stenosis.
IMPRESSION: CT ABDOMEN AND PELVIS:

1. No CT evidence for nephrolithiasis or obstructive.
2. No other acute intra-abdominal or pelvic process identified.
3. Moderate retained stool within the colon, which could reflect
constipation.

CT LUMBAR SPINE:

1. No acute abnormality within the lumbar spine.
2. Degenerative disc disease with facet hypertrophy at L4-5 with
resultant mild to moderate canal with bilateral subarticular
stenosis, with moderate left greater than right L4 foraminal
narrowing.
3. Small central disc protrusion at L5-S1, closely approximating and
potentially affecting either of the descending S1 nerve roots
bilaterally.

## 2021-04-13 DIAGNOSIS — I25110 Atherosclerotic heart disease of native coronary artery with unstable angina pectoris: Secondary | ICD-10-CM | POA: Insufficient documentation

## 2021-04-14 DIAGNOSIS — I2089 Other forms of angina pectoris: Secondary | ICD-10-CM | POA: Insufficient documentation

## 2021-04-14 DIAGNOSIS — Z9181 History of falling: Secondary | ICD-10-CM | POA: Insufficient documentation

## 2023-07-24 ENCOUNTER — Encounter: Payer: Self-pay | Admitting: Dermatology

## 2023-07-24 ENCOUNTER — Ambulatory Visit: Payer: Medicare PPO | Admitting: Dermatology

## 2023-07-24 VITALS — BP 104/71 | HR 86

## 2023-07-24 DIAGNOSIS — L280 Lichen simplex chronicus: Secondary | ICD-10-CM

## 2023-07-24 DIAGNOSIS — E785 Hyperlipidemia, unspecified: Secondary | ICD-10-CM | POA: Insufficient documentation

## 2023-07-24 DIAGNOSIS — L649 Androgenic alopecia, unspecified: Secondary | ICD-10-CM | POA: Diagnosis not present

## 2023-07-24 DIAGNOSIS — I119 Hypertensive heart disease without heart failure: Secondary | ICD-10-CM | POA: Insufficient documentation

## 2023-07-24 MED ORDER — CLOBETASOL PROPIONATE 0.05 % EX OINT: 1.0000 | TOPICAL_OINTMENT | Freq: Two times a day (BID) | CUTANEOUS | 3 refills | Status: DC

## 2023-07-24 MED ORDER — SAFETY SEAL MISCELLANEOUS MISC: 1.0000 | Freq: Every day | 2 refills | Status: AC

## 2023-07-24 NOTE — Progress Notes (Signed)
   New Patient Visit   Subjective  Sandra Reed is a 77 y.o. female who presents for the following: scalp issues and hair loss  Patient states she has dandruff located at the scalp that she would like to have examined. Patient reports the areas have been there for throughout her lifetime off and on. She reports the areas are bothersome describing her scalp has a rough feeling. Patient rates irritation 10 out of 10. She states that the areas have not spread. Patient reports she has previously been treated for these areas by Derm in [place], [place] 1 1/2 years ago. She was prescribed a cream however she has D/C'd because the scalp issues had resolved after use. Patient report previous Hx of Bx of the scalp. Patient denies family history of skin cancer(s).  The patient has spots, moles and lesions to be evaluated, some may be new or changing and the patient may have concern these could be cancer.   The following portions of the chart were reviewed this encounter and updated as appropriate: medications, allergies, medical history  Review of Systems:  No other skin or systemic complaints except as noted in HPI or Assessment and Plan.  Objective  Well appearing patient in no apparent distress; mood and affect are within normal limits.  A focused examination was performed of the following areas: scalp   Relevant exam findings are noted in the Assessment and Plan.      \       Assessment & Plan   LICHEN SIMPLEX CHRONICUS/PRURIGO NODULARIS Exam: Excoriated lichenified papules and/or plaques at the scalp  Not at goal  Lichen simplex chronicus [facility]) is a persistent itchy area of thickened skin that is induced by chronic rubbing and/or scratching (chronic dermatitis).  These areas may be pink, hyperpigmented and may have excoriations and bumps (prurigo nodules- PN).  LSC/PN is commonly observed in uncontrolled atopic dermatitis and other forms of eczema, and in other itchy skin conditions (eg,  insect bites, scabies).  Sometimes it is not possible to know initial cause of LSC/PN if it has been present for a long time.  It generally responds well to treatment with high potency topical steroids.  It is important to stop rubbing/scratching the area in order to break the itch-scratch-rash-itch cycle, in order for the rash to resolve.   Treatment Plan: - Prescribed clobetasol ointment (TEMOVATE) 0.05 % -Recommend DHS Zinc shampoo for inflammation and dandruff prevention, available over-the-counter on [employer]. 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 - Plan: Prescribe  Brogaine (minoxidil/finasteride) [facility] pharmacy liquid solution for hair loss and regrowth, to be applied once daily in the morning. Schedule a follow-up appointment in 3 months to assess progress. Inform patient that it is safe to use clobetasol for 3 months on the scalp.    No follow-ups on file.    Documentation: I have reviewed the above documentation for accuracy and completeness, and I agree with the above.  I, Shirron Marcha Solders, CMA, am acting as scribe for [employer], DO.   [name], DO

## 2023-07-24 NOTE — Patient Instructions (Addendum)
Hello Sandra Reed,  Thank you for visiting my office today. Your dedication to addressing your scalp concerns and improving your overall health is greatly appreciated. Below is a summary of our discussion and your personalized treatment plan:  lichen simplex chronicus (rough patches on the back of the scalp) - Clobetasol Ointment:   - Application: Apply twice daily to the rough patches on the back of the scalp.   - Duration: Continue for 3 months.  - DHS Zinc Shampoo:   - Usage: Use this shampoo to calm inflammation and prevent dandruff.   - Availability: Recommended to purchase via [employer], available over-the-counter.  Androgenetic Alopecia (Hair thinning at temples) - Prescription Minoxidil/Finasteride  from [facility]:   - Application: Apply a few drops to each area every morning.   - Note: [facility] will contact you for payment and shipping details. The prescription is expected to last 2-3 months.  - Follow-Up Appointment:   - Schedule: We have scheduled a follow-up in 3 months to monitor your progress.  - Product Recommendations:   - Continue using SheaMoisture products.   - Consider trying Pattern by Tracee Gustavus Bryant products for additional moisture and styling needs.  - Documentation:   - [employer] of your temples for our records.  Please do not hesitate to reach out if you have any questions or concerns before our next appointment.  Best regards,  Dr. [name] [facility]         Important Information  Due to recent changes in healthcare laws, you may see results of your pathology and/or laboratory studies on MyChart before the doctors have had a chance to review them. We understand that in some cases there may be results that are confusing or concerning to you. Please understand that not all results are received at the same time and often the doctors may need to interpret multiple results in order to provide you with the best plan of care or  course of treatment. Therefore, we ask that you please give [place] 2 business days to thoroughly review all your results before contacting the office for clarification. Should we see a critical lab result, you will be contacted sooner.   If You Need Anything After Your Visit  If you have any questions or concerns for your doctor, please call our main line at [phone] If no one answers, please leave a voicemail as directed and we will return your call as soon as possible. Messages left after 4 pm will be answered the following business day.   You may also send [place] a message via MyChart. We typically respond to MyChart messages within 1-2 business days.  For prescription refills, please ask your pharmacy to contact our office. Our fax number is [phone].  If you have an urgent issue when the clinic is closed that cannot wait until the next business day, you can page your doctor at the number below.    Please note that while we do our best to be available for urgent issues outside of office hours, we are not available 24/7.   If you have an urgent issue and are unable to reach [place], you may choose to seek medical care at your doctor's office, retail clinic, urgent care center, or emergency room.  If you have a medical emergency, please immediately call 911 or go to the emergency department. In the event of inclement weather, please call our main line at [phone] for an update on the status of any delays or closures.  Dermatology Medication Tips: Please keep the boxes that topical medications come in in order to help keep track of the instructions about where and how to use these. Pharmacies typically print the medication instructions only on the boxes and not directly on the medication tubes.   If your medication is too expensive, please contact our office at [phone] or send [place] a message through MyChart.   We are unable to tell what your co-pay for medications will be in advance as  this is different depending on your insurance coverage. However, we may be able to find a substitute medication at lower cost or fill out paperwork to get insurance to cover a needed medication.   If a prior authorization is required to get your medication covered by your insurance company, please allow [place] 1-2 business days to complete this process.  Drug prices often vary depending on where the prescription is filled and some pharmacies may offer cheaper prices.  The website www.goodrx.com contains coupons for medications through different pharmacies. The prices here do not account for what the cost may be with help from insurance (it may be cheaper with your insurance), but the website can give you the price if you did not use any insurance.  - You can print the associated coupon and take it with your prescription to the pharmacy.  - You may also stop by our office during regular business hours and pick up a GoodRx coupon card.  - If you need your prescription sent electronically to a different pharmacy, notify our office through [facility] or by phone at [phone]

## 2023-08-19 ENCOUNTER — Other Ambulatory Visit: Payer: Self-pay

## 2023-08-19 DIAGNOSIS — L280 Lichen simplex chronicus: Secondary | ICD-10-CM

## 2023-08-19 MED ORDER — CLOBETASOL PROPIONATE 0.05 % EX OINT: 1.0000 | TOPICAL_OINTMENT | Freq: Two times a day (BID) | CUTANEOUS | 3 refills | Status: AC

## 2023-08-19 NOTE — Progress Notes (Signed)
Pt requested larger tube of med

## 2023-10-30 ENCOUNTER — Ambulatory Visit: Payer: Medicare PPO | Admitting: Dermatology

## 2023-10-30 ENCOUNTER — Encounter: Payer: Self-pay | Admitting: Dermatology

## 2023-10-30 VITALS — BP 89/60 | HR 87

## 2023-10-30 DIAGNOSIS — L280 Lichen simplex chronicus: Secondary | ICD-10-CM

## 2023-10-30 DIAGNOSIS — L649 Androgenic alopecia, unspecified: Secondary | ICD-10-CM

## 2023-10-30 DIAGNOSIS — L2989 Other pruritus: Secondary | ICD-10-CM

## 2023-10-30 NOTE — Patient Instructions (Addendum)
 Hello Elonna,  Thank you for visiting us  today.   Here is a summary of the key instructions from today's consultation:  Minoxidil Finasteride Combination: Continue applying this treatment to the affected areas on your frontal hairline scalp to stimulate hair growth. Use it every morning or every other morning as you have been doing.  Clobetasol  Cream: Apply this cream to any itchy areas. It is also effective for general itching.  DHS Zinc Shampoo: Use this shampoo to manage scalp itching. Try not to extend the interval between uses beyond two weeks.  Hair Growth Supplements: Consider using Viviscal, which is a more affordable alternative to Nutrafol, to promote hair growth. It requires taking two tablets daily and costs about $45 per month.  Follow-Up: We will schedule your next appointment in six months to monitor your progress.  Educational Material: You will receive an after-visit summary with a picture of Viviscal for your reference. You can purchase it at [employer] or through their website, and possibly find it on sale at [employer].  We look forward to continuing to support your health journey. If you have any questions or need any refills on your medications, please do not hesitate to contact our office.  Warm regards,  Dr. Delon Lenis,  Dermatology              Important Information   Due to recent changes in healthcare laws, you may see results of your pathology and/or laboratory studies on MyChart before the doctors have had a chance to review them. We understand that in some cases there may be results that are confusing or concerning to you. Please understand that not all results are received at the same time and often the doctors may need to interpret multiple results in order to provide you with the best plan of care or course of treatment. Therefore, we ask that you please give us  2 business days to thoroughly review all your results before contacting the office for  clarification. Should we see a critical lab result, you will be contacted sooner.     If You Need Anything After Your Visit   If you have any questions or concerns for your doctor, please call our main line at [phone]. If no one answers, please leave a voicemail as directed and we will return your call as soon as possible. Messages left after 4 pm will be answered the following business day.    You may also send us  a message via MyChart. We typically respond to MyChart messages within 1-2 business days.  For prescription refills, please ask your pharmacy to contact our office. Our fax number is [phone].  If you have an urgent issue when the clinic is closed that cannot wait until the next business day, you can page your doctor at the number below.     Please note that while we do our best to be available for urgent issues outside of office hours, we are not available 24/7.    If you have an urgent issue and are unable to reach us , you may choose to seek medical care at your doctor's office, retail clinic, urgent care center, or emergency room.   If you have a medical emergency, please immediately call 911 or go to the emergency department. In the event of inclement weather, please call our main line at [phone] for an update on the status of any delays or closures.  Dermatology Medication Tips: Please keep the boxes that topical medications come in in order  to help keep track of the instructions about where and how to use these. Pharmacies typically print the medication instructions only on the boxes and not directly on the medication tubes.   If your medication is too expensive, please contact our office at [phone] or send us  a message through MyChart.    We are unable to tell what your co-pay for medications will be in advance as this is different depending on your insurance coverage. However, we may be able to find a substitute medication at lower cost or fill out  paperwork to get insurance to cover a needed medication.    If a prior authorization is required to get your medication covered by your insurance company, please allow us  1-2 business days to complete this process.   Drug prices often vary depending on where the prescription is filled and some pharmacies may offer cheaper prices.   The website www.goodrx.com contains coupons for medications through different pharmacies. The prices here do not account for what the cost may be with help from insurance (it may be cheaper with your insurance), but the website can give you the price if you did not use any insurance.  - You can print the associated coupon and take it with your prescription to the pharmacy.  - You may also stop by our office during regular business hours and pick up a GoodRx coupon card.  - If you need your prescription sent electronically to a different pharmacy, notify our office through [facility] or by phone at [phone]

## 2023-10-30 NOTE — Progress Notes (Signed)
   Follow-Up Visit   Subjective  Sandra Reed is a 78 y.o. female who presents for the following: Lichen Simplex Chronicus and AA  Patient present today for follow up visit for LICHEN SIMPLEX CHRONICUS/PRURIGO NODULARIS and AA. At this visit pt was prescribed Clobetasol  cream to apply to the effected areas on the scalp, recommended to wash with DHS Zinc Shampoo. She was alos prescribed Brogaine to [facility]. Patient was last evaluated on 07/24/23. Patient reports sxs are better. Patient denies medication changes. She also would like to know if it ok for her to take Nutralfol Hair Vitamins to take daily.   The following portions of the chart were reviewed this encounter and updated as appropriate: medications, allergies, medical history  Review of Systems:  No other skin or systemic complaints except as noted in HPI or Assessment and Plan.  Objective  Well appearing patient in no apparent distress; mood and affect are within normal limits.  A focused examination was performed of the following areas: Scalp  Relevant exam findings are noted in the Assessment and Plan.           Assessment & Plan   Female Pattern Hair Loss Assessment: Thinning at the temples, Notable improvement with the use of a minoxidil-finasteride combination from [facility] pharmacy.  Plan: Continue the minoxidil-finasteride combination, applying to the affected areas every morning or every other morning. Consider adding Viviscal supplement, 2 tablets daily, to promote hair growth. Follow up in 6 months to assess progress with photos.  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 Clobetasol  cream has been used, initially for itching.  Plan: Continue clobetasol  cream on affected areas to promote hair regrowth, not just for itching. Discontinue  application to areas where hair has regrown to allow for further growth.  Scalp Pruritus ([facility] D[facility]) Assessment: Reports of itching on the scalp, with DHS Zinc Shampoo found to be effective in managing symptoms.  Plan: Continue DHS Zinc Shampoo as maintenance therapy, using once a week or every two weeks, ensuring not to exceed a 2-week interval between uses. Use clobetasol  cream on itchy areas as needed. Consider Viviscal supplement, which may also help with overall scalp health, as mentioned in the female pattern hair loss plan.   No follow-ups on file.  Documentation: I have reviewed the above documentation for accuracy and completeness, and I agree with the above.  I, Jetta Ager, am acting as scribe for Delon Lenis, DO.  Delon Lenis, DO

## 2023-11-13 ENCOUNTER — Encounter (HOSPITAL_COMMUNITY): Payer: Self-pay | Admitting: Emergency Medicine

## 2023-11-13 ENCOUNTER — Other Ambulatory Visit: Payer: Self-pay

## 2023-11-13 ENCOUNTER — Emergency Department (HOSPITAL_COMMUNITY)
Admission: EM | Admit: 2023-11-13 | Discharge: 2023-11-13 | Disposition: A | Discharge status: Home / Self Care | Payer: Medicare PPO | Attending: Emergency Medicine | Admitting: Emergency Medicine

## 2023-11-13 ENCOUNTER — Emergency Department (HOSPITAL_COMMUNITY): Payer: Medicare PPO

## 2023-11-13 DIAGNOSIS — Z7984 Long term (current) use of oral hypoglycemic drugs: Secondary | ICD-10-CM | POA: Insufficient documentation

## 2023-11-13 DIAGNOSIS — M25471 Effusion, right ankle: Secondary | ICD-10-CM

## 2023-11-13 DIAGNOSIS — Z794 Long term (current) use of insulin: Secondary | ICD-10-CM | POA: Insufficient documentation

## 2023-11-13 DIAGNOSIS — Z7982 Long term (current) use of aspirin: Secondary | ICD-10-CM | POA: Diagnosis not present

## 2023-11-13 DIAGNOSIS — E119 Type 2 diabetes mellitus without complications: Secondary | ICD-10-CM | POA: Insufficient documentation

## 2023-11-13 DIAGNOSIS — M7989 Other specified soft tissue disorders: Secondary | ICD-10-CM | POA: Insufficient documentation

## 2023-11-13 DIAGNOSIS — R600 Localized edema: Secondary | ICD-10-CM | POA: Insufficient documentation

## 2023-11-13 LAB — COMPREHENSIVE METABOLIC PANEL
ALT: 30 U/L (ref 0–44)
AST: 31 U/L (ref 15–41)
Albumin: 3.6 g/dL (ref 3.5–5.0)
Alkaline Phosphatase: 49 U/L (ref 38–126)
Anion gap: 8 (ref 5–15)
BUN: 14 mg/dL (ref 8–23)
CO2: 28 mmol/L (ref 22–32)
Calcium: 9.3 mg/dL (ref 8.9–10.3)
Chloride: 104 mmol/L (ref 98–111)
Creatinine, Ser: 0.71 mg/dL (ref 0.44–1.00)
GFR, Estimated: >60 mL/min (ref >60)
Glucose, Bld: 121 mg/dL — ABNORMAL HIGH (ref 70–99)
Potassium: 3.6 mmol/L (ref 3.5–5.1)
Sodium: 140 mmol/L (ref 135–145)
Total Bilirubin: 0.3 mg/dL (ref 0.0–1.2)
Total Protein: 7.1 g/dL (ref 6.5–8.1)

## 2023-11-13 LAB — CBC WITH DIFFERENTIAL/PLATELET
Abs Immature Granulocytes: 0.01 10*3/uL (ref 0.00–0.07)
Basophils Absolute: 0 10*3/uL (ref 0.0–0.1)
Basophils Relative: 1 %
Eosinophils Absolute: 0.1 10*3/uL (ref 0.0–0.5)
Eosinophils Relative: 1 %
HCT: 39.6 % (ref 36.0–46.0)
Hemoglobin: 12.4 g/dL (ref 12.0–15.0)
Immature Granulocytes: 0 %
Lymphocytes Relative: 34 %
Lymphs Abs: 2 10*3/uL (ref 0.7–4.0)
MCH: 30 pg (ref 26.0–34.0)
MCHC: 31.3 g/dL (ref 30.0–36.0)
MCV: 95.9 fL (ref 80.0–100.0)
Monocytes Absolute: 0.5 10*3/uL (ref 0.1–1.0)
Monocytes Relative: 9 %
Neutro Abs: 3.2 10*3/uL (ref 1.7–7.7)
Neutrophils Relative %: 55 %
Platelets: 186 10*3/uL (ref 150–400)
RBC: 4.13 MIL/uL (ref 3.87–5.11)
RDW: 13.2 % (ref 11.5–15.5)
WBC: 5.7 10*3/uL (ref 4.0–10.5)
nRBC: 0 % (ref 0.0–0.2)

## 2023-11-13 LAB — BRAIN NATRIURETIC PEPTIDE: B Natriuretic Peptide: 34.1 pg/mL (ref 0.0–100.0)

## 2023-11-13 MED ORDER — KETOROLAC TROMETHAMINE 30 MG/ML IJ SOLN
15.0000 mg | Freq: Once | INTRAMUSCULAR | Status: AC
  Administered 2023-11-13: 15 mg via INTRAMUSCULAR
  Filled 2023-11-13: qty 1

## 2023-11-13 MED ORDER — METHYLPREDNISOLONE 4 MG PO TBPK: ORAL_TABLET | ORAL | 0 refills | Status: AC

## 2023-11-13 NOTE — Discharge Instructions (Signed)
You were seen today for right ankle swelling.  You may have an inflammatory arthritis.  Trial of Medrol Dosepak and follow-up closely with your primary doctor.

## 2023-11-13 NOTE — ED Triage Notes (Signed)
Pt reports swelling in right ankle. Reports it gets worse when she walks on it throughout the day. Pain noted as well. Denies SHOB/chest pain

## 2023-11-13 NOTE — ED Provider Notes (Signed)
[place] EMERGENCY DEPARTMENT AT [facility] Provider Note   CSN: [id number] Arrival date & time: 11/13/23  0034     History  Chief Complaint  Patient presents with   Joint Swelling    Sandra Reed is a 78 y.o. female.  HPI     This is a 78 year old female who presents with right ankle swelling.  Patient reports approximately 1 week of intermittent right ankle swelling.  It improves at night when she elevates her ankle.  Has not had any calf swelling.  Denies any injury.  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  She has not noted any fevers.  No systemic symptoms.  Reports pain with ambulation.  Denies chest pain or shortness of breath.  No history of gout.  Home Medications Prior to Admission medications   Medication Sig Start Date End Date Taking? Authorizing Provider  methylPREDNISolone (MEDROL DOSEPAK) 4 MG TBPK tablet Take as directed on package. 11/13/23  Yes Rodnesha Elie, Mayer Masker, MD  acetaminophen (TYLENOL) 650 MG CR tablet Take 650 mg by mouth every 8 (eight) hours as needed for pain.    [provider]  alum & mag hydroxide-simeth (MAALOX/MYLANTA) 200-200-20 MG/5ML suspension Take 30 mLs by mouth every 6 (six) hours as needed for indigestion. 12/19/16   Marguerita Merles Latif, DO  aspirin 81 MG chewable tablet Chew 81 mg by mouth at bedtime.     [provider]  clobetasol ointment (TEMOVATE) 0.05 % Apply 1 Application topically 2 (two) times daily. 08/19/23   Terri Piedra, DO  diclofenac sodium (VOLTAREN) 1 % GEL Apply 4 g topically 4 (four) times daily. 07/08/18   Melene Plan, DO  DYMISTA 137-50 MCG/ACT SUSP Place 1 spray into both nostrils 2 (two) times daily. 05/12/18   [provider]  gabapentin (NEURONTIN) 300 MG capsule Take 300 mg by mouth at bedtime.     [provider]  GLIPIZIDE XL 5 MG 24 hr tablet Take 5 mg by mouth daily. 05/20/18   [provider]  insulin glargine (LANTUS) 100 unit/mL SOPN Inject 45 Units  into the skin at bedtime.    [provider]  JARDIANCE 10 MG TABS tablet Take 10 mg by mouth daily. 06/12/18   [provider]  levothyroxine (SYNTHROID, LEVOTHROID) 50 MCG tablet Take 50 mcg by mouth daily before breakfast.    [provider]  lisinopril-hydrochlorothiazide (PRINZIDE,ZESTORETIC) 10-12.5 MG tablet Take 1 tablet by mouth daily.     [provider]  metFORMIN (GLUCOPHAGE-XR) 500 MG 24 hr tablet Take 1,000 mg by mouth 2 (two) times daily.  08/27/16   [provider]  metoprolol (LOPRESSOR) 50 MG tablet Take 50 mg by mouth daily.     [provider]  montelukast (SINGULAIR) 10 MG tablet Take 1 tablet (10 mg total) by mouth at bedtime. 07/27/16   Sherren Mocha, MD  nitroGLYCERIN (NITROSTAT) 0.4 MG SL tablet Place 0.4 mg under the tongue. Every 5 minutes as needed for chest pain. 06/03/18   [provider]  omeprazole (PRILOSEC) 40 MG capsule Take 40 mg by mouth daily.    [provider]  pantoprazole (PROTONIX) 40 MG tablet Take 40 mg by mouth daily. 06/17/18   [provider]  pentoxifylline (TRENTAL) 400 MG CR tablet Take 400 mg by mouth 2 (two) times daily with a meal.     [provider]  pravastatin (PRAVACHOL) 20 MG tablet Take 20 mg by mouth at bedtime.  [provider]  Safety Seal Miscellaneous MISC Apply 1 Application topically daily. Medication Name: Ailene Rud made with Minoxidil USP 7% and Finasteride USP 0.1%. Apply 2 drops to each area of hair loss and hairline every morning. 07/24/23   Terri Piedra, DO  sucralfate (CARAFATE) 1 g tablet Take 1 tablet (1 g total) by mouth 3 (three) times daily with meals. Patient taking differently: Take 1 g by mouth 4 (four) times daily. 01/18/17   Antony Madura, PA-C      Allergies    Patient has no known allergies.    Review of Systems   Review of Systems  Constitutional:  Negative for fever.  Respiratory:  Negative for shortness of  breath.   Cardiovascular:  Negative for chest pain.  Musculoskeletal:        Ankle pain  All other systems reviewed and are negative.   Physical Exam Updated Vital Signs BP 107/64   Pulse 85   Temp 98.4 F (36.9 C)   Resp 16   SpO2 98%  Physical Exam Vitals and nursing note reviewed.  Constitutional:      Appearance: She is well-developed.     Comments: Elderly, non-ill-appearing  HENT:     Head: Normocephalic and atraumatic.  Eyes:     Pupils: Pupils are equal, round, and reactive to light.  Cardiovascular:     Rate and Rhythm: Normal rate and regular rhythm.  Pulmonary:     Effort: Pulmonary effort is normal. No respiratory distress.  Abdominal:     Palpations: Abdomen is soft.  Musculoskeletal:     Cervical back: Neck supple.     Comments: Focused examination of the right lower extremity with isolated swelling about the right ankle, no swelling noted in the calf or foot, no overlying skin changes, no erythema, normal range of motion, 2+ DP pulse  Skin:    General: Skin is warm and dry.  Neurological:     Mental Status: She is alert and oriented to person, place, and time.  Psychiatric:        Mood and Affect: Mood normal.     ED Results / Procedures / Treatments   Labs (all labs ordered are listed, but only abnormal results are displayed) Labs Reviewed  COMPREHENSIVE METABOLIC PANEL - Abnormal; Notable for the following components:      Result Value   Glucose, Bld 121 (*)    All other components within normal limits  CBC WITH DIFFERENTIAL/PLATELET  BRAIN NATRIURETIC PEPTIDE    EKG None  Radiology DG Ankle Complete Right Result Date: 11/13/2023 CLINICAL DATA:  Swallowing her 1 week without injury. EXAM: RIGHT ANKLE - COMPLETE 3+ VIEW COMPARISON:  No comparison studies. FINDINGS: Four views are provided. There is mild-to-moderate soft tissue swelling medially, mild swelling laterally. No joint effusion is seen. There are calcifications in the anterior  tibial artery in the distal foreleg. There is no evidence of fracture, dislocation or arthritic changes. Normal bone mineralization. Moderate plantar and small dorsal calcaneal enthesopathic spurring is also seen as well as mild midfoot arthrosis. There is no radiopaque foreign body. An the accessory os peroneum is incidentally noted. IMPRESSION: 1. Soft tissue swelling without evidence of fractures. 2. Calcaneal noninflammatory enthesopathic spurring. 3. Mild midfoot arthrosis. 4. Atherosclerosis. Electronically Signed   By: Almira Bar M.D.   On: 11/13/2023 05:03    Procedures Procedures    Medications Ordered in ED Medications  ketorolac (TORADOL) 30 MG/ML injection 15 mg (15 mg Intramuscular Given 11/13/23 0324)  ED Course/ Medical Decision Making/ A&P                                 Medical Decision Making Amount and/or Complexity of Data Reviewed Labs: ordered. Radiology: ordered.  Risk Prescription drug management.   This patient presents to the ED for concern of ankle pain and swelling, this involves an extensive number of treatment options, and is a complaint that carries with it a high risk of complications and morbidity.  I considered the following differential and admission for this acute, potentially life threatening condition.  The differential diagnosis includes injury, inflammatory arthritis, septic arthritis, less likely DVT  MDM:    This is a 78 year old female who presents with right ankle swelling and pain.  She is nontoxic.  Vital signs are largely reassuring.  She has fairly isolated swelling to the right ankle.  Spares the dorsum of the foot and calf.  She has good neurovascular exam.  She has no overlying skin changes or warmth to suggest septic joint.  She has normal range of motion.  X-rays do not show any occult injury.  Labs obtained and reviewed and largely reassuring.  Would favor inflammatory arthritis.  Will treat with a Medrol Dosepak.  Patient had  significant improvement with Toradol.  (Labs, imaging, consults)  Labs: I Ordered, and personally interpreted labs.  The pertinent results include: CBC, BMP  Imaging Studies ordered: I ordered imaging studies including x-ray I independently visualized and interpreted imaging. I agree with the radiologist interpretation  Additional history obtained from chart review.  External records from outside source obtained and reviewed including prior evaluations  Cardiac Monitoring: The patient was not maintained on a cardiac monitor.  If on the cardiac monitor, I personally viewed and interpreted the cardiac monitored which showed an underlying rhythm of: N/A  Reevaluation: After the interventions noted above, I reevaluated the patient and found that they have :stayed the same  Social Determinants of Health:  lives independently  Disposition: Discharge  Co morbidities that complicate the patient evaluation  Past Medical History:  Diagnosis Date   Diabetes mellitus without complication (HCC)    GERD (gastroesophageal reflux disease)    Thyroid disease    Vertigo      Medicines Meds ordered this encounter  Medications   ketorolac (TORADOL) 30 MG/ML injection 15 mg   methylPREDNISolone (MEDROL DOSEPAK) 4 MG TBPK tablet    Sig: Take as directed on package.    Dispense:  21 tablet    Refill:  0    I have reviewed the patients home medicines and have made adjustments as needed  Problem List / ED Course: Problem List Items Addressed This Visit   None Visit Diagnoses       Right ankle swelling    -  Primary                   Final Clinical Impression(s) / ED Diagnoses Final diagnoses:  Right ankle swelling    Rx / DC Orders ED Discharge Orders          Ordered    methylPREDNISolone (MEDROL DOSEPAK) 4 MG TBPK tablet        11/13/23 0545              Shon Baton, MD 11/13/23 [id number]

## 2024-05-07 ENCOUNTER — Ambulatory Visit: Payer: Medicare PPO | Admitting: Dermatology

## 2024-10-14 ENCOUNTER — Ambulatory Visit: Admitting: Dermatology
